# Patient Record
Sex: Female | Born: 1990 | Race: Asian | Hispanic: No | Marital: Married | Smoking: Never smoker
Health system: Southern US, Community
[De-identification: ages and names within clinical notes are randomized; demographics above are authoritative.]

## PROBLEM LIST (undated history)

## (undated) DIAGNOSIS — O0930 Supervision of pregnancy with insufficient antenatal care, unspecified trimester: Secondary | ICD-10-CM

## (undated) DIAGNOSIS — R21 Rash and other nonspecific skin eruption: Secondary | ICD-10-CM

## (undated) HISTORY — DX: Supervision of pregnancy with insufficient antenatal care, unspecified trimester: O09.30

## (undated) HISTORY — DX: Rash and other nonspecific skin eruption: R21

---

## 2011-01-13 ENCOUNTER — Ambulatory Visit
Admission: RE | Admit: 2011-01-13 | Discharge: 2011-01-13 | Disposition: A | Discharge status: Home / Self Care | Payer: No Typology Code available for payment source | Source: Ambulatory Visit | Attending: Infectious Diseases | Admitting: Infectious Diseases

## 2011-01-13 ENCOUNTER — Other Ambulatory Visit: Payer: Self-pay | Admitting: Infectious Diseases

## 2011-01-13 DIAGNOSIS — R9389 Abnormal findings on diagnostic imaging of other specified body structures: Secondary | ICD-10-CM

## 2011-03-11 ENCOUNTER — Other Ambulatory Visit (HOSPITAL_COMMUNITY): Payer: Self-pay | Admitting: Physician Assistant

## 2011-03-11 DIAGNOSIS — O0930 Supervision of pregnancy with insufficient antenatal care, unspecified trimester: Secondary | ICD-10-CM

## 2011-03-11 DIAGNOSIS — Z3689 Encounter for other specified antenatal screening: Secondary | ICD-10-CM

## 2011-03-11 LAB — HIV ANTIBODY (ROUTINE TESTING W REFLEX): HIV: NONREACTIVE

## 2011-03-11 LAB — GC/CHLAMYDIA PROBE AMP, GENITAL
Chlamydia: NEGATIVE
Gonorrhea: NEGATIVE

## 2011-03-11 LAB — ABO/RH

## 2011-03-11 LAB — RPR: RPR: NONREACTIVE

## 2011-03-18 ENCOUNTER — Ambulatory Visit (HOSPITAL_COMMUNITY)
Admission: RE | Admit: 2011-03-18 | Discharge: 2011-03-18 | Disposition: A | Discharge status: Home / Self Care | Payer: Medicaid Other | Source: Ambulatory Visit | Attending: Physician Assistant | Admitting: Physician Assistant

## 2011-03-18 DIAGNOSIS — Z3689 Encounter for other specified antenatal screening: Secondary | ICD-10-CM

## 2011-03-18 DIAGNOSIS — Z1389 Encounter for screening for other disorder: Secondary | ICD-10-CM | POA: Insufficient documentation

## 2011-03-18 DIAGNOSIS — O0930 Supervision of pregnancy with insufficient antenatal care, unspecified trimester: Secondary | ICD-10-CM

## 2011-03-18 DIAGNOSIS — O358XX0 Maternal care for other (suspected) fetal abnormality and damage, not applicable or unspecified: Secondary | ICD-10-CM | POA: Insufficient documentation

## 2011-03-18 DIAGNOSIS — Z363 Encounter for antenatal screening for malformations: Secondary | ICD-10-CM | POA: Insufficient documentation

## 2011-06-06 ENCOUNTER — Other Ambulatory Visit (HOSPITAL_COMMUNITY): Payer: Self-pay | Admitting: Physician Assistant

## 2011-06-06 DIAGNOSIS — R109 Unspecified abdominal pain: Secondary | ICD-10-CM

## 2011-06-09 ENCOUNTER — Ambulatory Visit (HOSPITAL_COMMUNITY)
Admission: RE | Admit: 2011-06-09 | Discharge: 2011-06-09 | Disposition: A | Discharge status: Home / Self Care | Payer: Medicaid Other | Source: Ambulatory Visit | Attending: Physician Assistant | Admitting: Physician Assistant

## 2011-06-09 DIAGNOSIS — R1011 Right upper quadrant pain: Secondary | ICD-10-CM | POA: Insufficient documentation

## 2011-06-09 DIAGNOSIS — R109 Unspecified abdominal pain: Secondary | ICD-10-CM

## 2011-07-18 LAB — STREP B DNA PROBE: GBS: NEGATIVE

## 2011-08-14 ENCOUNTER — Other Ambulatory Visit (HOSPITAL_COMMUNITY): Payer: Self-pay | Admitting: Physician Assistant

## 2011-08-14 DIAGNOSIS — O480 Post-term pregnancy: Secondary | ICD-10-CM

## 2011-08-18 ENCOUNTER — Inpatient Hospital Stay (HOSPITAL_COMMUNITY)
Admission: AD | Admit: 2011-08-18 | Discharge: 2011-08-18 | Disposition: A | Discharge status: Home / Self Care | Payer: Medicaid Other | Source: Ambulatory Visit | Attending: Obstetrics & Gynecology | Admitting: Obstetrics & Gynecology

## 2011-08-18 ENCOUNTER — Encounter (HOSPITAL_COMMUNITY): Payer: Self-pay | Admitting: *Deleted

## 2011-08-18 ENCOUNTER — Ambulatory Visit (HOSPITAL_COMMUNITY)
Admission: RE | Admit: 2011-08-18 | Discharge: 2011-08-18 | Disposition: A | Discharge status: Home / Self Care | Payer: Medicaid Other | Source: Ambulatory Visit | Attending: Physician Assistant | Admitting: Physician Assistant

## 2011-08-18 DIAGNOSIS — O480 Post-term pregnancy: Secondary | ICD-10-CM | POA: Insufficient documentation

## 2011-08-18 DIAGNOSIS — Z3689 Encounter for other specified antenatal screening: Secondary | ICD-10-CM

## 2011-08-18 DIAGNOSIS — O368390 Maternal care for abnormalities of the fetal heart rate or rhythm, unspecified trimester, not applicable or unspecified: Secondary | ICD-10-CM | POA: Insufficient documentation

## 2011-08-18 DIAGNOSIS — Z36 Encounter for antenatal screening of mother: Secondary | ICD-10-CM

## 2011-08-18 NOTE — ED Provider Notes (Signed)
  History     CSN: [id number]  Arrival date and time: 08/18/11 1745   None     Chief Complaint  Patient presents with  . Non-stress Test  Nepali interpreter from [employer] used over phone. HPI 21 yo G1 here at 40weeks 5 days.  States she was sent over from the [facility] because of fetal tachycardia with a HR of 175bpm.  She states she had not had anything to eat or drink this am prior to her appointment.  She has had good fetal movement.  She denies feeling any contractions, denies vaginal bleeding, LOF, or discharge.  She had a BPP this morning.   OB History    Grav Para Term Preterm Abortions TAB SAB Ect Mult Living   1               No past medical history on file.  No past surgical history on file.  Family History  Problem Relation Age of Onset  . Hypertension Mother     History  Substance Use Topics  . Smoking status: Never Smoker   . Smokeless tobacco: Not on file  . Alcohol Use: No    Allergies: No Known Allergies  Prescriptions prior to admission  Medication Sig Dispense Refill  . Prenatal Vit-Fe Fumarate-FA (PRENATAL MULTIVITAMIN) TABS Take 1 tablet by mouth daily.        Review of Systems  Gastrointestinal: Negative for abdominal pain.  Genitourinary: Negative for dysuria, urgency and frequency.   Physical Exam   Blood pressure 124/74, pulse 87, temperature 98.6 F (37 C), temperature source Oral, resp. rate 18.  Physical Exam  Constitutional: She is oriented to person, place, and time. She appears well-developed and well-nourished. No distress.  GI: She exhibits distension (gravid). There is no tenderness.  Neurological: She is alert and oriented to person, place, and time.    MAU Course  Procedures  -Sent from [facility] for concern of fetal tachycardia  NST:  Contractions q10-12 minutes.  Category I tracing with good accels.  No decels noted.   Good variability  BPP 08/18/11: 8/8  Assessment and Plan  1.  Fetal tachycardia  -Not seen  during observation in MAU.  May have had accels to 175 when seen in clinic. NST  reassuring.  BPP 8/8 this morning reassuring.  -f/u [facility]  MATTHEWS,CODY 08/18/2011, 6:52 PM   I have seen and examined this patient in conjunction with Dr Ashley Royalty.  I have taken this history and performed the exam.  I agree with the note as written above and have made corrections as needed.   Candelaria Celeste JEHIEL 08/18/2011 7:32 PM

## 2011-08-18 NOTE — MAU Note (Signed)
Health dept called, fetal tach, ? Baseline.  Pt denies pain or bleeding.

## 2011-08-19 ENCOUNTER — Encounter (HOSPITAL_COMMUNITY): Payer: Self-pay | Admitting: *Deleted

## 2011-08-19 ENCOUNTER — Telehealth (HOSPITAL_COMMUNITY): Payer: Self-pay | Admitting: *Deleted

## 2011-08-19 NOTE — Telephone Encounter (Signed)
Preadmission screen Translator number [phone]

## 2011-08-23 ENCOUNTER — Encounter (HOSPITAL_COMMUNITY): Payer: Self-pay

## 2011-08-23 ENCOUNTER — Inpatient Hospital Stay (HOSPITAL_COMMUNITY)
Admission: RE | Admit: 2011-08-23 | Discharge: 2011-08-28 | DRG: 765 | Disposition: A | Discharge status: Home / Self Care | Payer: Medicaid Other | Source: Ambulatory Visit | Attending: Obstetrics & Gynecology | Admitting: Obstetrics & Gynecology

## 2011-08-23 VITALS — BP 114/82 | HR 90 | Temp 98.0°F | Resp 18 | Ht 64.0 in | Wt 176.0 lb

## 2011-08-23 DIAGNOSIS — O480 Post-term pregnancy: Principal | ICD-10-CM | POA: Diagnosis present

## 2011-08-23 DIAGNOSIS — N719 Inflammatory disease of uterus, unspecified: Secondary | ICD-10-CM | POA: Diagnosis present

## 2011-08-23 DIAGNOSIS — O2390 Unspecified genitourinary tract infection in pregnancy, unspecified trimester: Secondary | ICD-10-CM | POA: Diagnosis present

## 2011-08-23 DIAGNOSIS — Z98891 History of uterine scar from previous surgery: Secondary | ICD-10-CM

## 2011-08-23 LAB — CBC
Hemoglobin: 11.5 g/dL — ABNORMAL LOW (ref 12.0–15.0)
Platelets: 211 10*3/uL (ref 150–400)
RBC: 4.35 MIL/uL (ref 3.87–5.11)

## 2011-08-23 MED ORDER — OXYTOCIN BOLUS FROM INFUSION
500.0000 mL | Freq: Once | INTRAVENOUS | Status: DC
  Filled 2011-08-23: qty 500
  Filled 2011-08-23: qty 1000

## 2011-08-23 MED ORDER — FLEET ENEMA 7-19 GM/118ML RE ENEM: 1.0000 | ENEMA | RECTAL | Status: DC | PRN

## 2011-08-23 MED ORDER — MISOPROSTOL 25 MCG QUARTER TABLET
25.0000 ug | ORAL_TABLET | ORAL | Status: DC | PRN
  Administered 2011-08-23 – 2011-08-24 (×3): 25 ug via VAGINAL
  Filled 2011-08-23 (×3): qty 0.25

## 2011-08-23 MED ORDER — LIDOCAINE HCL (PF) 1 % IJ SOLN: 30.0000 mL | INTRAMUSCULAR | Status: DC | PRN

## 2011-08-23 MED ORDER — ZOLPIDEM TARTRATE 10 MG PO TABS
10.0000 mg | ORAL_TABLET | Freq: Every evening | ORAL | Status: DC | PRN
  Administered 2011-08-24: 10 mg via ORAL
  Filled 2011-08-23: qty 1

## 2011-08-23 MED ORDER — OXYTOCIN 20 UNITS IN LACTATED RINGERS INFUSION - SIMPLE
125.0000 mL/h | Freq: Once | INTRAVENOUS | Status: DC
  Filled 2011-08-23: qty 1000

## 2011-08-23 MED ORDER — ONDANSETRON HCL 4 MG/2ML IJ SOLN: 4.0000 mg | Freq: Four times a day (QID) | INTRAMUSCULAR | Status: DC | PRN

## 2011-08-23 MED ORDER — TERBUTALINE SULFATE 1 MG/ML IJ SOLN: 0.2500 mg | Freq: Once | INTRAMUSCULAR | Status: AC | PRN

## 2011-08-23 MED ORDER — CITRIC ACID-SODIUM CITRATE 334-500 MG/5ML PO SOLN
30.0000 mL | ORAL | Status: DC | PRN
  Administered 2011-08-26: 30 mL via ORAL
  Filled 2011-08-23: qty 15

## 2011-08-23 MED ORDER — LACTATED RINGERS IV SOLN
500.0000 mL | INTRAVENOUS | Status: DC | PRN
  Administered 2011-08-25: 500 mL via INTRAVENOUS

## 2011-08-23 MED ORDER — IBUPROFEN 600 MG PO TABS: 600.0000 mg | ORAL_TABLET | Freq: Four times a day (QID) | ORAL | Status: DC | PRN

## 2011-08-23 MED ORDER — LACTATED RINGERS IV SOLN
INTRAVENOUS | Status: DC
  Administered 2011-08-23 – 2011-08-26 (×10): via INTRAVENOUS

## 2011-08-23 MED ORDER — ACETAMINOPHEN 325 MG PO TABS
650.0000 mg | ORAL_TABLET | ORAL | Status: DC | PRN
  Administered 2011-08-26: 650 mg via ORAL
  Filled 2011-08-23: qty 2

## 2011-08-23 MED ORDER — BUTORPHANOL TARTRATE 2 MG/ML IJ SOLN
1.0000 mg | INTRAMUSCULAR | Status: DC | PRN
  Administered 2011-08-25: 1 mg via INTRAVENOUS
  Filled 2011-08-23: qty 1

## 2011-08-23 MED ORDER — OXYCODONE-ACETAMINOPHEN 5-325 MG PO TABS: 1.0000 | ORAL_TABLET | ORAL | Status: DC | PRN

## 2011-08-23 NOTE — H&P (Signed)
Kelly Rosario is a 21 y.o. female G1 presenting for IOL for post dates pregnancy.  Reports no contractions, vaginal bleeding/discharge, leaking of fluid.  She is feeling baby move well.  Would like to do nexplanon for contraception and plans to breast feed Maternal Medical History:  Reason for admission: Reason for Admission:   nauseaIOL for post dates pregnancy   Fetal activity: Perceived fetal activity is normal.      OB History    Grav Para Term Preterm Abortions TAB SAB Ect Mult Living   1              Past Medical History  Diagnosis Date  . Late prenatal care   . Rash     atarax used   History reviewed. No pertinent past surgical history. Family History: family history includes Hypertension in her mother. Social History:  reports that she has never smoked. She has never used smokeless tobacco. She reports that she does not drink alcohol or use illicit drugs.  Review of Systems  Constitutional: Negative for fever and chills.  Eyes: Negative for blurred vision.  Respiratory: Negative for cough.   Cardiovascular: Negative for chest pain.  Gastrointestinal: Negative for nausea.  Neurological: Negative for headaches.    Dilation: Fingertip Effacement (%): 20 Station: -3 Exam by:: Dr. Jerolyn Center Blood pressure 135/85, pulse 106, height 5\' 4"  (1.626 m), weight 79.833 kg (176 lb). Maternal Exam:  Uterine Assessment: Contraction strength is mild.  Contraction duration is 45 seconds. Contraction frequency is irregular.   Abdomen: Fetal presentation: vertex  Pelvis: adequate for delivery.      Fetal Exam Fetal Monitor Review: Baseline rate: 170.  Variability: moderate (6-25 bpm).   Pattern: accelerations present and no decelerations.    Fetal State Assessment: Category I - tracings are normal.     Physical Exam  Constitutional: She is oriented to person, place, and time. She appears well-nourished. No distress.  HENT:  Mouth/Throat: Oropharynx is clear and moist.    Neck: Neck supple.  Cardiovascular: Normal rate, regular rhythm and normal heart sounds.   Respiratory: Effort normal and breath sounds normal. No respiratory distress.  GI: She exhibits distension (gravid). There is no tenderness.  Musculoskeletal: She exhibits edema (1+).  Neurological: She is alert and oriented to person, place, and time.    Prenatal labs: ABO, Rh: A/Positive/-- (10/23 0000) Antibody: Negative (10/23 0000) Rubella: Immune (10/23 0000) RPR: Nonreactive (10/23 0000)  HBsAg: Negative (10/23 0000)  HIV: Non-reactive (10/23 0000)  GBS: Negative (03/01 0000)   Assessment/Plan: 1. IOL for post-dates of 41w3 days gestation  -Bishops of 1, will start with cytotec  -GBS negative  -Nexplanon for contraception PP  -Plans to breast feed.   Yunique Dearcos 08/23/2011, 8:45 PM

## 2011-08-23 NOTE — Progress Notes (Signed)
Pt signs consent form to let husband translate.

## 2011-08-23 NOTE — H&P (Signed)
Attestation of Attending Supervision of Resident: Evaluation and management procedures were performed by the [facility] Resident under my supervision.  I have seen and examined the patient, reviewed the resident's note and chart, and I agree with management and plan.   Jaynie Collins, M.D. 08/23/2011 9:49 PM

## 2011-08-24 ENCOUNTER — Encounter (HOSPITAL_COMMUNITY): Payer: Self-pay

## 2011-08-24 LAB — PROTEIN / CREATININE RATIO, URINE
Creatinine, Urine: 24.58 mg/dL
Protein Creatinine Ratio: 0.16 — ABNORMAL HIGH (ref 0.00–0.15)

## 2011-08-24 LAB — COMPREHENSIVE METABOLIC PANEL
ALT: 12 U/L (ref 0–35)
AST: 17 U/L (ref 0–37)
Calcium: 9.3 mg/dL (ref 8.4–10.5)
Creatinine, Ser: 0.45 mg/dL — ABNORMAL LOW (ref 0.50–1.10)
GFR calc Af Amer: >90 mL/min (ref >90)
Glucose, Bld: 67 mg/dL — ABNORMAL LOW (ref 70–99)
Sodium: 133 mEq/L — ABNORMAL LOW (ref 135–145)
Total Protein: 5.9 g/dL — ABNORMAL LOW (ref 6.0–8.3)

## 2011-08-24 LAB — RPR: RPR Ser Ql: NONREACTIVE

## 2011-08-24 NOTE — Progress Notes (Signed)
Elasia Inscoe is a 21 y.o. G1P0 at [redacted]w[redacted]d by 19 wk  ultrasound admitted for induction of labor due to Post dates. Due date 08/13/11.  Subjective: Anxious. Slept most of night. Denies pain.   Objective: BP 124/86  Pulse 79  Temp(Src) 97.9 F (36.6 C) (Oral)  Resp 16  Ht 5\' 4"  (1.626 m)  Wt 79.833 kg (176 lb)  BMI 30.21 kg/m2      FHT:  135 baseline, moderate variability, accels present, occ mild variable UC:   irregular, every 6 minutes SVE:   Dilation: Fingertip Effacement (%): 20 Station: -2 Exam by:: Dorissa Stinnette Posterior, soft, ft to closed, cytotec placed in posterior fornix Labs: Lab Results  Component Value Date   WBC 13.1* 08/23/2011   HGB 11.5* 08/23/2011   HCT 35.2* 08/23/2011   MCV 80.9 08/23/2011   PLT 211 08/23/2011    Assessment / Plan: G1 at 41.4 wks BEGA Cx unfavorable  Recheck in 4 hrs  Labor: not in labor Preeclampsia:   Fetal Wellbeing:  Category I Pain Control:  n/a Anticipated MOD:  NSVD  Lisbet Busker 08/24/2011, 9:05 AM

## 2011-08-24 NOTE — Progress Notes (Addendum)
Patient ID: Kelly Rosario, female   DOB: Aug 12, 1990, 21 y.o.   MRN: [id number] Gael Delude is a 21 y.o. G1P0 at [redacted]w[redacted]d admitted forIOL indicated by postdates   Subjective: Comfortable and dozing until about 1 hr ago began feeling contractions. Declines analgesia. No H/A or visual disturbance.  Objective: BP 126/90  Pulse 87  Temp(Src) 97.9 F (36.6 C) (Oral)  Resp 18  Ht 5\' 4"  (1.626 m)  Wt 79.833 kg (176 lb)  BMI 30.21 kg/m2 Range BPs: 101-136/56-97  Fetal Heart FHR: 140 bpm, variability: moderate,  accelerations:  Present,  decelerations:  Absent except occ mild variable when supine  Contractions: q2-3 min x 50-90 sec  SVE:   Dilation: Fingertip Effacement (%): 20 Station: -2 Exam by:: Valma Rotenberg  Cx posterior tight 1/60/-2 Spec: Cx visualized and Foley bulb placed  Assessment / Plan:   Labor: none, cx unripe BP elevations - > check preE labs Fetal Wellbeing: Category 1 Pain Control:  Coping Expected mode of delivery: NSVD  Gissele Narducci 08/24/2011, 3:19 PM

## 2011-08-24 NOTE — Progress Notes (Signed)
Travia Winthrop is a 21 y.o. G1P0 at [redacted]w[redacted]d   Subjective: Comfortable, occasional ctx  Objective: BP 116/71  Pulse 88  Temp(Src) 98.5 F (36.9 C) (Oral)  Resp 18  Ht 5\' 4"  (1.626 m)  Wt 176 lb (79.833 kg)  BMI 30.21 kg/m2      FHT:  FHR: 140 bpm, variability: moderate,  accelerations:  Present,  decelerations:  Absent UC:   irregular, every 8-10 minutes SVE:  Foley bulb in place  Labs: Lab Results  Component Value Date   WBC 13.1* 08/23/2011   HGB 11.5* 08/23/2011   HCT 35.2* 08/23/2011   MCV 80.9 08/23/2011   PLT 211 08/23/2011    Assessment / Plan: Cervical Ripening in progress  Labor: Progressing normally Preeclampsia:  n/a Fetal Wellbeing:  Category I Pain Control:  Labor support without medications I/D:  n/a Anticipated MOD:  NSVD Ambien prn sleep  Georgian Mcclory E. 08/24/2011, 11:43 PM

## 2011-08-24 NOTE — Progress Notes (Signed)
Reene Strandberg is a 21 y.o. G1P0 at [redacted]w[redacted]d admitted for induction of labor due to Post dates.   Subjective: Sleeping, appears comfortable  Objective: BP 109/70  Pulse 94  Temp(Src) 98.7 F (37.1 C) (Oral)  Resp 14  Ht 5\' 4"  (1.626 m)  Wt 79.833 kg (176 lb)  BMI 30.21 kg/m2      FHT:  FHR: 150 bpm, variability: moderate,  accelerations:  Present,  decelerations:  Absent UC:   irregular, every 1-5 minutes SVE:   Dilation: Fingertip Effacement (%): 20 Station: -2 Exam by:: Lilli Few, RN  Labs: Lab Results  Component Value Date   WBC 13.1* 08/23/2011   HGB 11.5* 08/23/2011   HCT 35.2* 08/23/2011   MCV 80.9 08/23/2011   PLT 211 08/23/2011    Assessment / Plan: Induction of labor due to postterm,  2nd cytotec placed by RN  Labor: Progressing normally Fetal Wellbeing:  Category I Pain Control:  Labor support without medications I/D:  n/a Anticipated MOD:  NSVD  Jonessa Triplett 08/24/2011, 1:57 AM

## 2011-08-24 NOTE — Progress Notes (Signed)
Kelly Rosario is a 21 y.o. G1P0 at [redacted]w[redacted]d   Subjective: Feeling some contractions  Objective: BP 134/87  Pulse 81  Temp(Src) 98.3 F (36.8 C) (Oral)  Resp 18  Ht 5\' 4"  (1.626 m)  Wt 176 lb (79.833 kg)  BMI 30.21 kg/m2      FHT:  FHR: 135 bpm, variability: moderate,  accelerations:  Present,  decelerations:  Absent UC:   irregular, every 2-7 minutes SVE:   Dilation: 1 Effacement (%): 60 Station: -2 Exam by:: Poe  Labs: Lab Results  Component Value Date   WBC 13.1* 08/23/2011   HGB 11.5* 08/23/2011   HCT 35.2* 08/23/2011   MCV 80.9 08/23/2011   PLT 211 08/23/2011    Assessment / Plan: IOL in progress  Labor: Foley in place for cervical ripening Preeclampsia:  No s/s Fetal Wellbeing:  Category I Pain Control:  Labor support without medications I/D:  n/a Anticipated MOD:  NSVD  Eusevio Schriver E. 08/24/2011, 8:25 PM

## 2011-08-25 MED ORDER — EPHEDRINE 5 MG/ML INJ: 10.0000 mg | INTRAVENOUS | Status: DC | PRN

## 2011-08-25 MED ORDER — LIDOCAINE HCL (PF) 1 % IJ SOLN
INTRAMUSCULAR | Status: DC | PRN
  Administered 2011-08-25 (×3): 4 mL

## 2011-08-25 MED ORDER — LACTATED RINGERS IV SOLN
500.0000 mL | Freq: Once | INTRAVENOUS | Status: AC
  Administered 2011-08-25: 500 mL via INTRAVENOUS

## 2011-08-25 MED ORDER — TERBUTALINE SULFATE 1 MG/ML IJ SOLN: 0.2500 mg | Freq: Once | INTRAMUSCULAR | Status: AC | PRN

## 2011-08-25 MED ORDER — FENTANYL 2.5 MCG/ML BUPIVACAINE 1/10 % EPIDURAL INFUSION (WH - ANES)
14.0000 mL/h | INTRAMUSCULAR | Status: DC
  Administered 2011-08-25 – 2011-08-26 (×8): 14 mL/h via EPIDURAL
  Filled 2011-08-25 (×9): qty 60

## 2011-08-25 MED ORDER — EPHEDRINE 5 MG/ML INJ
10.0000 mg | INTRAVENOUS | Status: DC | PRN
  Filled 2011-08-25: qty 4

## 2011-08-25 MED ORDER — OXYTOCIN 20 UNITS IN LACTATED RINGERS INFUSION - SIMPLE
1.0000 m[IU]/min | INTRAVENOUS | Status: DC
  Administered 2011-08-25: 2 m[IU]/min via INTRAVENOUS
  Administered 2011-08-26 (×2): 18 m[IU]/min via INTRAVENOUS
  Administered 2011-08-26 (×2): 14 m[IU]/min via INTRAVENOUS
  Administered 2011-08-26: 16 m[IU]/min via INTRAVENOUS

## 2011-08-25 MED ORDER — DIPHENHYDRAMINE HCL 50 MG/ML IJ SOLN: 12.5000 mg | INTRAMUSCULAR | Status: DC | PRN

## 2011-08-25 MED ORDER — PHENYLEPHRINE 40 MCG/ML (10ML) SYRINGE FOR IV PUSH (FOR BLOOD PRESSURE SUPPORT)
80.0000 ug | PREFILLED_SYRINGE | INTRAVENOUS | Status: DC | PRN
  Filled 2011-08-25: qty 5

## 2011-08-25 MED ORDER — PHENYLEPHRINE 40 MCG/ML (10ML) SYRINGE FOR IV PUSH (FOR BLOOD PRESSURE SUPPORT): 80.0000 ug | PREFILLED_SYRINGE | INTRAVENOUS | Status: DC | PRN

## 2011-08-25 NOTE — Progress Notes (Signed)
Davonna Hagadorn is a 21 y.o. G1P0 at [redacted]w[redacted]d   Subjective: Sleeping well  Objective: BP 103/70  Pulse 92  Temp(Src) 98.5 F (36.9 C) (Axillary)  Resp 16  Ht 5\' 4"  (1.626 m)  Wt 176 lb (79.833 kg)  BMI 30.21 kg/m2      FHT:  FHR: 145 bpm, variability: moderate,  accelerations:  Present,  decelerations:  Absent UC:   irregular, every 10 minutes SVE:  Foley bulb removed. CVX: 4/thick/vtx/-3  Labs: Lab Results  Component Value Date   WBC 13.1* 08/23/2011   HGB 11.5* 08/23/2011   HCT 35.2* 08/23/2011   MCV 80.9 08/23/2011   PLT 211 08/23/2011    Assessment / Plan: IOL inprogress  Labor: Will start pitocin Preeclampsia:  n/a Fetal Wellbeing:  Category I Pain Control:  Labor support without medications I/D:  n/a Anticipated MOD:  NSVD  Deandra Goering E. 08/25/2011, 5:44 AM

## 2011-08-25 NOTE — Progress Notes (Signed)
Kelly Rosario is a 21 y.o. G1P0 at [redacted]w[redacted]d  admitted for induction of labor due to Post dates.  Subjective: Becoming more uncomfortable.  Interpreter line used, requesting epidural.   Objective: BP 102/69  Pulse 83  Temp(Src) 98 F (36.7 C) (Oral)  Resp 18  Ht 5\' 4"  (1.626 m)  Wt 79.833 kg (176 lb)  BMI 30.21 kg/m2      FHT:  FHR: 140 bpm, variability: minimal ,  accelerations:  Present,  decelerations:  Absent- Just received stadol UC:   regular, every 1-5 minutes SVE:   Dilation: 5.5 Effacement (%): 60 Station: -2 Exam by:: Lorretta Harp RNC  Labs: Lab Results  Component Value Date   WBC 13.1* 08/23/2011   HGB 11.5* 08/23/2011   HCT 35.2* 08/23/2011   MCV 80.9 08/23/2011   PLT 211 08/23/2011    Assessment / Plan: Induction of labor due to postterm,  progressing well on pitocin  Labor: Progressing normally Preeclampsia:  n/a Fetal Wellbeing:  Category II Pain Control:  Stadol, planning for epidural I/D:  n/a Anticipated MOD:  NSVD  Cesilia Shinn 08/25/2011, 3:13 PM

## 2011-08-25 NOTE — Anesthesia Preprocedure Evaluation (Signed)
Anesthesia Evaluation  Patient identified by MRN, date of birth, ID band Patient awake    Reviewed: Allergy & Precautions, H&P , NPO status , Patient's Chart, lab work & pertinent test results, reviewed documented beta blocker date and time   History of Anesthesia Complications Negative for: history of anesthetic complications  Airway Mallampati: IV TM Distance: >3 FB Neck ROM: full    Dental  (+) Teeth Intact   Pulmonary neg pulmonary ROS,  breath sounds clear to auscultation        Cardiovascular negative cardio ROS  Rhythm:regular Rate:Normal     Neuro/Psych negative neurological ROS  negative psych ROS   GI/Hepatic negative GI ROS, Neg liver ROS,   Endo/Other  negative endocrine ROS  Renal/GU negative Renal ROS     Musculoskeletal   Abdominal   Peds  Hematology negative hematology ROS (+)   Anesthesia Other Findings   Reproductive/Obstetrics (+) Pregnancy                           Anesthesia Physical Anesthesia Plan  ASA: II  Anesthesia Plan: Epidural   Post-op Pain Management:    Induction:   Airway Management Planned:   Additional Equipment:   Intra-op Plan:   Post-operative Plan:   Informed Consent: I have reviewed the patients History and Physical, chart, labs and discussed the procedure including the risks, benefits and alternatives for the proposed anesthesia with the patient or authorized representative who has indicated his/her understanding and acceptance.     Plan Discussed with:   Anesthesia Plan Comments:         Anesthesia Quick Evaluation  

## 2011-08-25 NOTE — Progress Notes (Signed)
   Subjective: Pt comfortable after epidural  Objective: BP 121/76  Pulse 93  Temp(Src) 97.8 F (36.6 C) (Oral)  Resp 20  Ht 5\' 4"  (1.626 m)  Wt 79.833 kg (176 lb)  BMI 30.21 kg/m2  SpO2 96%      FHT:  FHR: 120's bpm, variability: moderate,  accelerations:  Present,  decelerations:  Present variables; late decel x 2 with return to baseline. UC:   irregular, every 5-6 minutes; MVU 120-160 SVE:   Dilation: 5 Effacement (%): 60 Station: -2;-1 Exam by:: Lorretta Harp RNC  Labs: Lab Results  Component Value Date   WBC 13.1* 08/23/2011   HGB 11.5* 08/23/2011   HCT 35.2* 08/23/2011   MCV 80.9 08/23/2011   PLT 211 08/23/2011    Assessment / Plan: Induction of Labor, Post-term Inadequate Contraction Pattern  Labor: Inadequate Contractions Preeclampsia:  n/a Fetal Wellbeing:  Category II Pain Control:  Epidural I/D:  n/a Anticipated MOD:  NSVD  [facility] 08/25/2011, 6:03 PM

## 2011-08-25 NOTE — Anesthesia Procedure Notes (Addendum)
Epidural Patient location during procedure: OB Start time: 08/25/2011 3:41 PM Reason for block: procedure for pain  Staffing Anesthesiologist: Khriz Liddy Performed by: anesthesiologist   Preanesthetic Checklist Completed: patient identified, site marked, surgical consent, pre-op evaluation, timeout performed, IV checked, risks and benefits discussed and monitors and equipment checked  Epidural Patient position: sitting Prep: site prepped and draped and DuraPrep Patient monitoring: continuous pulse ox and blood pressure Approach: midline Injection technique: LOR air  Needle:  Needle type: Tuohy  Needle gauge: 17 G Needle length: 9 cm Needle insertion depth: 4 cm Catheter type: closed end flexible Catheter size: 19 Gauge Catheter at skin depth: 9 cm Test dose: negative  Assessment Events: blood not aspirated, injection not painful, no injection resistance, negative IV test and no paresthesia  Additional Notes Discussed risk of headache, infection, bleeding, nerve injury and failed or incomplete block.  Patient voices understanding and wishes to proceed.  Explained with assistance of [place] line interpreter throughout procedure.  Jasmine December, MD

## 2011-08-25 NOTE — Progress Notes (Addendum)
Kelly Rosario is a 21 y.o. G1P0 at [redacted]w[redacted]d  admitted for induction of labor due to Post dates.  Subjective: Called by RN because patient with tachysystole, late decels and poor variability on FHT.   Patient denies feeling any pressure, pain  Objective: BP 128/76  Pulse 80  Temp(Src) 98.5 F (36.9 C) (Oral)  Resp 20  Ht 5\' 4"  (1.626 m)  Wt 79.833 kg (176 lb)  BMI 30.21 kg/m2      FHT:  FHR: 140 bpm, variability: minimal ,  accelerations:  Present,  decelerations:  Present lates Position changed, fluid bolus given, pitocin dose reduced by 1/2, and started on O2.  FHT improved with better variability and no further decels x10 minutes and contractions slowed.   UC:   regular, every 1-2 minutes, pitocin decreased now every 2-5 min SVE:   Dilation: 4.5 Effacement (%): 50 Station: -3 Exam by:: Dr. Vernard Gambles exam due to patient unable to relax, difficult to feel if membranes intact although Mod. Meconium on glove and bed pad, patient states she started leaking fluid around 5pm yesterday after going to the bathroom.  Recheck by Sid Falcon, CNM confirms ROM, FSE and IUPC placed.   Nepali Nurse, learning disability through [employer] used to explain procedures.   Labs: Lab Results  Component Value Date   WBC 13.1* 08/23/2011   HGB 11.5* 08/23/2011   HCT 35.2* 08/23/2011   MCV 80.9 08/23/2011   PLT 211 08/23/2011    Assessment / Plan: IOL for post dates, FHT improved with decrease in pitocin, O2, fluid bolus and positional change.    Labor: Slowly titrate pitocin back up, appears to have SROM yesterday around 5pm Preeclampsia:  n/a Fetal Wellbeing:  Category II Pain Control:  Labor support without medications I/D:  n/a Anticipated MOD:  NSVD   Continue to monitor Kelly Rosario 08/25/2011, 11:01 AM

## 2011-08-25 NOTE — Progress Notes (Signed)
Jennalee Wrenn is a 21 y.o. G1P0 at [redacted]w[redacted]d admitted for induction of labor due to Post dates. Subjective: Comfortable, no complaints  Objective: BP 120/93  Pulse 103  Temp(Src) 98.5 F (36.9 C) (Oral)  Resp 20  Ht 5\' 4"  (1.626 m)  Wt 79.833 kg (176 lb)  BMI 30.21 kg/m2      FHT:  FHR: 140 bpm, variability: moderate,  accelerations:  Present,  decelerations:  Absent UC:   regular, every 1-3 minutes SVE:   Dilation: 4.5 Effacement (%): 50 Station: -3 Exam by:: Dr. Ashley Royalty  Labs: Lab Results  Component Value Date   WBC 13.1* 08/23/2011   HGB 11.5* 08/23/2011   HCT 35.2* 08/23/2011   MCV 80.9 08/23/2011   PLT 211 08/23/2011    Assessment / Plan: Induction of labor due to postterm,  progressing slowly on pitocin  Labor: Progressing on Pitocin, will continue to increase then AROM Preeclampsia:  N/a Fetal Wellbeing:  Category I Pain Control:  Labor support without medications I/D:  n/a Anticipated MOD:  NSVD  Madaleine Simmon 08/25/2011, 9:29 AM

## 2011-08-25 NOTE — Progress Notes (Signed)
Dr. Margot Ables aware of inadequate contraction pattern, uncertainty of IUPC function and dose of pitocin.  Waiting on attending provider to discuss plan of care and may turn off pitocin.

## 2011-08-25 NOTE — Progress Notes (Signed)
Kelly Rosario is a 21 y.o. G1P0 at [redacted]w[redacted]d   Subjective: Doing well. No urge to push. No abdominal pain  Objective: BP 129/80  Pulse 104  Temp(Src) 98.8 F (37.1 C) (Oral)  Resp 20  Ht 5\' 4"  (1.626 m)  Wt 79.833 kg (176 lb)  BMI 30.21 kg/m2  SpO2 96% I/O last 3 completed shifts: In: -  Out: 850 [Urine:850]    FHT:  FHR: 130-140s bpm, variability: moderate,  accelerations:  Abscent,  decelerations:  Absent UC:   regular, every 2 minutes SVE:   Dilation: 6 Effacement (%): 90 Station: -1 Exam by:: Cammy Copa, RN  Labs: Lab Results  Component Value Date   WBC 13.1* 08/23/2011   HGB 11.5* 08/23/2011   HCT 35.2* 08/23/2011   MCV 80.9 08/23/2011   PLT 211 08/23/2011    Assessment / Plan: Augmentation of labor, progressing well. Fetal scalp monitor no longer working but unable to remove. IUCP in place.  Labor: Will allow pt to rest from pit. Likely restart in 4 hrs Fetal Wellbeing:  Category I Pain Control:  Epidural I/D:  No signs of corioamnionitis Anticipated MOD:  NSVD  Kelly Rosario 08/25/2011, 8:43 PM

## 2011-08-26 ENCOUNTER — Inpatient Hospital Stay (HOSPITAL_COMMUNITY): Payer: Medicaid Other | Admitting: Anesthesiology

## 2011-08-26 ENCOUNTER — Encounter (HOSPITAL_COMMUNITY): Payer: Self-pay

## 2011-08-26 ENCOUNTER — Encounter (HOSPITAL_COMMUNITY): Payer: Self-pay | Admitting: Anesthesiology

## 2011-08-26 ENCOUNTER — Encounter (HOSPITAL_COMMUNITY)
Admission: RE | Disposition: A | Discharge status: Home / Self Care | Payer: Self-pay | Source: Ambulatory Visit | Attending: Obstetrics & Gynecology

## 2011-08-26 DIAGNOSIS — O480 Post-term pregnancy: Secondary | ICD-10-CM

## 2011-08-26 DIAGNOSIS — N719 Inflammatory disease of uterus, unspecified: Secondary | ICD-10-CM

## 2011-08-26 DIAGNOSIS — O2390 Unspecified genitourinary tract infection in pregnancy, unspecified trimester: Secondary | ICD-10-CM

## 2011-08-26 LAB — CBC
HCT: 30.7 % — ABNORMAL LOW (ref 36.0–46.0)
Hemoglobin: 10.2 g/dL — ABNORMAL LOW (ref 12.0–15.0)
MCH: 26.5 pg (ref 26.0–34.0)
MCH: 26.7 pg (ref 26.0–34.0)
MCHC: 33.2 g/dL (ref 30.0–36.0)
MCV: 80.6 fL (ref 78.0–100.0)
MCV: 80.4 fL (ref 78.0–100.0)
Platelets: 151 10*3/uL (ref 150–400)
Platelets: 158 10*3/uL (ref 150–400)
RBC: 4.07 MIL/uL (ref 3.87–5.11)
RBC: 3.82 MIL/uL — ABNORMAL LOW (ref 3.87–5.11)
RDW: 14.5 % (ref 11.5–15.5)
RDW: 14.5 % (ref 11.5–15.5)
WBC: 19.6 10*3/uL — ABNORMAL HIGH (ref 4.0–10.5)
WBC: 20.5 10*3/uL — ABNORMAL HIGH (ref 4.0–10.5)

## 2011-08-26 LAB — COMPREHENSIVE METABOLIC PANEL
ALT: 10 U/L (ref 0–35)
AST: 18 U/L (ref 0–37)
Albumin: 2.6 g/dL — ABNORMAL LOW (ref 3.5–5.2)
CO2: 20 mEq/L (ref 19–32)
Calcium: 9 mg/dL (ref 8.4–10.5)
Creatinine, Ser: 0.52 mg/dL (ref 0.50–1.10)
GFR calc non Af Amer: >90 mL/min (ref >90)
Sodium: 133 mEq/L — ABNORMAL LOW (ref 135–145)

## 2011-08-26 SURGERY — Surgical Case
Anesthesia: Regional | Site: Abdomen | Wound class: Clean Contaminated

## 2011-08-26 MED ORDER — LACTATED RINGERS IV SOLN
INTRAVENOUS | Status: DC | PRN
  Administered 2011-08-26 (×3): via INTRAVENOUS

## 2011-08-26 MED ORDER — MEPERIDINE HCL 25 MG/ML IJ SOLN: 6.2500 mg | INTRAMUSCULAR | Status: DC | PRN

## 2011-08-26 MED ORDER — PRENATAL MULTIVITAMIN CH
1.0000 | ORAL_TABLET | Freq: Every day | ORAL | Status: DC
  Administered 2011-08-28: 1 via ORAL
  Filled 2011-08-26: qty 1

## 2011-08-26 MED ORDER — NALOXONE HCL 0.4 MG/ML IJ SOLN
1.0000 ug/kg/h | INTRAMUSCULAR | Status: DC | PRN
  Filled 2011-08-26: qty 2.5

## 2011-08-26 MED ORDER — DIBUCAINE 1 % RE OINT: 1.0000 "application " | TOPICAL_OINTMENT | RECTAL | Status: DC | PRN

## 2011-08-26 MED ORDER — SENNOSIDES-DOCUSATE SODIUM 8.6-50 MG PO TABS
2.0000 | ORAL_TABLET | Freq: Every day | ORAL | Status: DC
  Administered 2011-08-26 – 2011-08-27 (×2): 2 via ORAL

## 2011-08-26 MED ORDER — TETANUS-DIPHTH-ACELL PERTUSSIS 5-2.5-18.5 LF-MCG/0.5 IM SUSP
0.5000 mL | Freq: Once | INTRAMUSCULAR | Status: AC
  Administered 2011-08-28: 0.5 mL via INTRAMUSCULAR
  Filled 2011-08-26: qty 0.5

## 2011-08-26 MED ORDER — LIDOCAINE-EPINEPHRINE (PF) 2 %-1:200000 IJ SOLN
INTRAMUSCULAR | Status: AC
  Filled 2011-08-26: qty 20

## 2011-08-26 MED ORDER — FENTANYL CITRATE 0.05 MG/ML IJ SOLN
INTRAMUSCULAR | Status: DC | PRN
  Administered 2011-08-26 (×2): 50 ug via INTRAVENOUS

## 2011-08-26 MED ORDER — CHLOROPROCAINE HCL 3 % IJ SOLN
INTRAMUSCULAR | Status: AC
  Filled 2011-08-26: qty 20

## 2011-08-26 MED ORDER — NALBUPHINE SYRINGE 5 MG/0.5 ML
5.0000 mg | INJECTION | INTRAMUSCULAR | Status: DC | PRN
  Filled 2011-08-26: qty 1

## 2011-08-26 MED ORDER — CLINDAMYCIN PHOSPHATE 900 MG/50ML IV SOLN
900.0000 mg | Freq: Three times a day (TID) | INTRAVENOUS | Status: AC
  Administered 2011-08-26 – 2011-08-27 (×3): 900 mg via INTRAVENOUS
  Filled 2011-08-26 (×3): qty 50

## 2011-08-26 MED ORDER — FENTANYL CITRATE 0.05 MG/ML IJ SOLN
INTRAMUSCULAR | Status: AC
  Administered 2011-08-26: 50 ug via INTRAVENOUS
  Filled 2011-08-26: qty 2

## 2011-08-26 MED ORDER — DIPHENHYDRAMINE HCL 25 MG PO CAPS: 25.0000 mg | ORAL_CAPSULE | Freq: Four times a day (QID) | ORAL | Status: DC | PRN

## 2011-08-26 MED ORDER — DIPHENHYDRAMINE HCL 25 MG PO CAPS: 25.0000 mg | ORAL_CAPSULE | ORAL | Status: DC | PRN

## 2011-08-26 MED ORDER — ONDANSETRON HCL 4 MG/2ML IJ SOLN: 4.0000 mg | Freq: Three times a day (TID) | INTRAMUSCULAR | Status: DC | PRN

## 2011-08-26 MED ORDER — BUPIVACAINE HCL (PF) 0.25 % IJ SOLN
INTRAMUSCULAR | Status: DC | PRN
  Administered 2011-08-26: 5 mL via EPIDURAL

## 2011-08-26 MED ORDER — IBUPROFEN 600 MG PO TABS
600.0000 mg | ORAL_TABLET | Freq: Four times a day (QID) | ORAL | Status: DC
  Administered 2011-08-27 – 2011-08-28 (×7): 600 mg via ORAL
  Filled 2011-08-26 (×2): qty 1

## 2011-08-26 MED ORDER — GENTAMICIN SULFATE 40 MG/ML IJ SOLN
170.0000 mg | Freq: Three times a day (TID) | INTRAVENOUS | Status: AC
  Administered 2011-08-26 – 2011-08-27 (×4): 170 mg via INTRAVENOUS
  Filled 2011-08-26 (×4): qty 4.25

## 2011-08-26 MED ORDER — SIMETHICONE 80 MG PO CHEW: 80.0000 mg | CHEWABLE_TABLET | ORAL | Status: DC | PRN

## 2011-08-26 MED ORDER — ONDANSETRON HCL 4 MG PO TABS: 4.0000 mg | ORAL_TABLET | ORAL | Status: DC | PRN

## 2011-08-26 MED ORDER — ONDANSETRON HCL 4 MG/2ML IJ SOLN
INTRAMUSCULAR | Status: AC
  Filled 2011-08-26: qty 2

## 2011-08-26 MED ORDER — SODIUM CHLORIDE 0.9 % IV SOLN
2.0000 g | Freq: Four times a day (QID) | INTRAVENOUS | Status: DC
  Administered 2011-08-26: 2 g via INTRAVENOUS
  Filled 2011-08-26 (×3): qty 2000

## 2011-08-26 MED ORDER — FENTANYL CITRATE 0.05 MG/ML IJ SOLN
100.0000 ug | Freq: Once | INTRAMUSCULAR | Status: AC
  Administered 2011-08-26: 100 ug via EPIDURAL

## 2011-08-26 MED ORDER — SCOPOLAMINE 1 MG/3DAYS TD PT72
1.0000 | MEDICATED_PATCH | Freq: Once | TRANSDERMAL | Status: DC
Start: 2011-08-26 — End: 2011-08-28
  Filled 2011-08-26: qty 1

## 2011-08-26 MED ORDER — CHLOROPROCAINE HCL 3 % IJ SOLN
INTRAMUSCULAR | Status: DC | PRN
  Administered 2011-08-26: 10 mL via EPIDURAL

## 2011-08-26 MED ORDER — MORPHINE SULFATE (PF) 0.5 MG/ML IJ SOLN
INTRAMUSCULAR | Status: DC | PRN
  Administered 2011-08-26: 1 mg via INTRAVENOUS

## 2011-08-26 MED ORDER — KETOROLAC TROMETHAMINE 30 MG/ML IJ SOLN
30.0000 mg | Freq: Four times a day (QID) | INTRAMUSCULAR | Status: DC | PRN
  Administered 2011-08-26: 30 mg via INTRAVENOUS
  Filled 2011-08-26: qty 1

## 2011-08-26 MED ORDER — WITCH HAZEL-GLYCERIN EX PADS: 1.0000 "application " | MEDICATED_PAD | CUTANEOUS | Status: DC | PRN

## 2011-08-26 MED ORDER — SODIUM CHLORIDE 0.9 % IJ SOLN: 3.0000 mL | INTRAMUSCULAR | Status: DC | PRN

## 2011-08-26 MED ORDER — DIPHENHYDRAMINE HCL 50 MG/ML IJ SOLN
12.5000 mg | INTRAMUSCULAR | Status: DC | PRN
  Administered 2011-08-27 (×2): 12.5 mg via INTRAVENOUS
  Filled 2011-08-26 (×2): qty 1

## 2011-08-26 MED ORDER — OXYCODONE-ACETAMINOPHEN 5-325 MG PO TABS
1.0000 | ORAL_TABLET | ORAL | Status: DC | PRN
  Administered 2011-08-27 (×2): 2 via ORAL
  Administered 2011-08-27: 1 via ORAL
  Administered 2011-08-28 (×2): 2 via ORAL
  Filled 2011-08-26: qty 1
  Filled 2011-08-26 (×3): qty 2

## 2011-08-26 MED ORDER — LANOLIN HYDROUS EX OINT: 1.0000 "application " | TOPICAL_OINTMENT | CUTANEOUS | Status: DC | PRN

## 2011-08-26 MED ORDER — FENTANYL CITRATE 0.05 MG/ML IJ SOLN
25.0000 ug | INTRAMUSCULAR | Status: DC | PRN
  Administered 2011-08-26 (×2): 50 ug via INTRAVENOUS

## 2011-08-26 MED ORDER — ONDANSETRON HCL 4 MG/2ML IJ SOLN
INTRAMUSCULAR | Status: DC | PRN
  Administered 2011-08-26: 4 mg via INTRAVENOUS

## 2011-08-26 MED ORDER — KETOROLAC TROMETHAMINE 60 MG/2ML IM SOLN
60.0000 mg | Freq: Once | INTRAMUSCULAR | Status: AC | PRN
  Filled 2011-08-26: qty 2

## 2011-08-26 MED ORDER — OXYTOCIN 20 UNITS IN LACTATED RINGERS INFUSION - SIMPLE: 125.0000 mL/h | INTRAVENOUS | Status: DC

## 2011-08-26 MED ORDER — ONDANSETRON HCL 4 MG/2ML IJ SOLN
4.0000 mg | INTRAMUSCULAR | Status: DC | PRN
  Administered 2011-08-27: 4 mg via INTRAVENOUS
  Filled 2011-08-26: qty 2

## 2011-08-26 MED ORDER — SODIUM BICARBONATE 8.4 % IV SOLN
INTRAVENOUS | Status: DC | PRN
  Administered 2011-08-26 (×2): 5 mL via EPIDURAL

## 2011-08-26 MED ORDER — KETOROLAC TROMETHAMINE 30 MG/ML IJ SOLN: 30.0000 mg | Freq: Four times a day (QID) | INTRAMUSCULAR | Status: DC | PRN

## 2011-08-26 MED ORDER — SIMETHICONE 80 MG PO CHEW
80.0000 mg | CHEWABLE_TABLET | Freq: Three times a day (TID) | ORAL | Status: DC
  Administered 2011-08-26 – 2011-08-28 (×7): 80 mg via ORAL

## 2011-08-26 MED ORDER — ZOLPIDEM TARTRATE 5 MG PO TABS: 5.0000 mg | ORAL_TABLET | Freq: Every evening | ORAL | Status: DC | PRN

## 2011-08-26 MED ORDER — DIPHENHYDRAMINE HCL 50 MG/ML IJ SOLN: 25.0000 mg | INTRAMUSCULAR | Status: DC | PRN

## 2011-08-26 MED ORDER — OXYTOCIN 20 UNITS IN LACTATED RINGERS INFUSION - SIMPLE
INTRAVENOUS | Status: DC | PRN
  Administered 2011-08-26 (×3): 20 [IU] via INTRAVENOUS

## 2011-08-26 MED ORDER — MENTHOL 3 MG MT LOZG: 1.0000 | LOZENGE | OROMUCOSAL | Status: DC | PRN

## 2011-08-26 MED ORDER — MORPHINE SULFATE (PF) 0.5 MG/ML IJ SOLN
INTRAMUSCULAR | Status: DC | PRN
  Administered 2011-08-26: 4 mg via EPIDURAL

## 2011-08-26 MED ORDER — MORPHINE SULFATE 0.5 MG/ML IJ SOLN
INTRAMUSCULAR | Status: AC
  Filled 2011-08-26: qty 10

## 2011-08-26 MED ORDER — METOCLOPRAMIDE HCL 5 MG/ML IJ SOLN: 10.0000 mg | Freq: Three times a day (TID) | INTRAMUSCULAR | Status: DC | PRN

## 2011-08-26 MED ORDER — OXYTOCIN 10 UNIT/ML IJ SOLN
INTRAMUSCULAR | Status: AC
  Filled 2011-08-26: qty 6

## 2011-08-26 MED ORDER — SODIUM BICARBONATE 8.4 % IV SOLN
INTRAVENOUS | Status: AC
  Filled 2011-08-26: qty 50

## 2011-08-26 MED ORDER — IBUPROFEN 600 MG PO TABS
600.0000 mg | ORAL_TABLET | Freq: Four times a day (QID) | ORAL | Status: DC | PRN
  Filled 2011-08-26 (×4): qty 1

## 2011-08-26 MED ORDER — NALOXONE HCL 0.4 MG/ML IJ SOLN: 0.4000 mg | INTRAMUSCULAR | Status: DC | PRN

## 2011-08-26 MED ORDER — FENTANYL CITRATE 0.05 MG/ML IJ SOLN
INTRAMUSCULAR | Status: AC
  Filled 2011-08-26: qty 2

## 2011-08-26 MED ORDER — LACTATED RINGERS IV SOLN
INTRAVENOUS | Status: DC
  Administered 2011-08-26 – 2011-08-27 (×2): via INTRAVENOUS

## 2011-08-26 SURGICAL SUPPLY — 26 items
BARRIER ADHS 3X4 INTERCEED (GAUZE/BANDAGES/DRESSINGS) IMPLANT
CHLORAPREP W/TINT 26ML (MISCELLANEOUS) ×2 IMPLANT
CLOTH BEACON ORANGE TIMEOUT ST (SAFETY) ×2 IMPLANT
DRSG COVADERM 4X10 (GAUZE/BANDAGES/DRESSINGS) ×2 IMPLANT
ELECT REM PT RETURN 9FT ADLT (ELECTROSURGICAL) ×2
ELECTRODE REM PT RTRN 9FT ADLT (ELECTROSURGICAL) ×1 IMPLANT
EXTRACTOR VACUUM KIWI (MISCELLANEOUS) IMPLANT
GLOVE BIO SURGEON STRL SZ 6.5 (GLOVE) ×2 IMPLANT
GLOVE BIOGEL PI IND STRL 7.0 (GLOVE) ×2 IMPLANT
GLOVE BIOGEL PI INDICATOR 7.0 (GLOVE) ×2
GOWN PREVENTION PLUS LG XLONG (DISPOSABLE) ×6 IMPLANT
KIT ABG SYR 3ML LUER SLIP (SYRINGE) IMPLANT
NEEDLE HYPO 25X5/8 SAFETYGLIDE (NEEDLE) IMPLANT
NS IRRIG 1000ML POUR BTL (IV SOLUTION) ×2 IMPLANT
PACK C SECTION WH (CUSTOM PROCEDURE TRAY) ×2 IMPLANT
RETRACTOR WND ALEXIS 25 LRG (MISCELLANEOUS) ×1 IMPLANT
RTRCTR WOUND ALEXIS 25CM LRG (MISCELLANEOUS) ×2
SLEEVE SCD COMPRESS KNEE LRG (MISCELLANEOUS) IMPLANT
SLEEVE SCD COMPRESS KNEE MED (MISCELLANEOUS) ×2 IMPLANT
SUT VIC AB 0 CT1 36 (SUTURE) ×12 IMPLANT
SUT VIC AB 2-0 CT1 27 (SUTURE) ×1
SUT VIC AB 2-0 CT1 TAPERPNT 27 (SUTURE) ×1 IMPLANT
SUT VIC AB 4-0 PS2 27 (SUTURE) ×2 IMPLANT
TOWEL OR 17X24 6PK STRL BLUE (TOWEL DISPOSABLE) ×4 IMPLANT
TRAY FOLEY CATH 14FR (SET/KITS/TRAYS/PACK) IMPLANT
WATER STERILE IRR 1000ML POUR (IV SOLUTION) IMPLANT

## 2011-08-26 NOTE — Progress Notes (Signed)
Kelly Rosario is a 21 y.o. G1P0 at [redacted]w[redacted]d   Subjective: Pushing x 1 hour now  Objective: BP 146/93  Pulse 112  Temp(Src) 100 F (37.8 C) (Oral)  Resp 18  Ht 5\' 4"  (1.626 m)  Wt 79.833 kg (176 lb)  BMI 30.21 kg/m2  SpO2 96% I/O last 3 completed shifts: In: -  Out: 3400 [Urine:3400] Total I/O In: 95 [I.V.:95] Out: -   FHT:  FHR: 125 bpm, variability: moderate,  accelerations:  Present,  decelerations:  Absent UC:   regular, every 2-4 minutes with 44mu/min Pitocin SVE:   Dilation: 10 Effacement (%): 100 Station: +2 Exam by:: Philipp Deputy, CNM- vtx visible with 'cheating', but mainly caput present; appears to be slowly bringing head down  Labs: Lab Results  Component Value Date   WBC 19.6* 08/26/2011   HGB 10.8* 08/26/2011   HCT 32.8* 08/26/2011   MCV 80.6 08/26/2011   PLT 151 08/26/2011    Assessment / Plan: Continue pushing  Kelly Rosario 08/26/2011, 1:00 PM

## 2011-08-26 NOTE — Progress Notes (Signed)
ANTIBIOTIC CONSULT NOTE - INITIAL  Pharmacy Consult for gentamicin Indication: maternal temp  No Known Allergies  Patient Measurements: Height: 5\' 4"  (162.6 cm) Weight: 176 lb (79.833 kg) IBW/kg (Calculated) : 54.7  Adjusted Body Weight: 62.3 kg  Vital Signs: Temp: 100.4 F (38 C) (04/09 1301) Temp src: Axillary (04/09 1301) BP: 140/99 mmHg (04/09 1401) Pulse Rate: 121  (04/09 1401) Intake/Output from previous day: 04/08 0701 - 04/09 0700 In: -  Out: 3400 [Urine:3400] Intake/Output from this shift: Total I/O In: 311.6 [P.O.:120; I.V.:191.6] Out: 725 [Urine:725]  Labs:  [facility] 08/26/11 0828 08/24/11 2150 08/24/11 1641 08/23/11 1930  WBC 19.6* -- -- 13.1*  HGB 10.8* -- -- 11.5*  PLT 151 -- -- 211  LABCREA -- 24.58 -- --  CREATININE 0.52 -- 0.45* --   Estimated Creatinine Clearance: 112.7 ml/min (by C-G formula based on Cr of 0.52). No results found for this basename: VANCOTROUGH:2,VANCOPEAK:2,VANCORANDOM:2,GENTTROUGH:2,GENTPEAK:2,GENTRANDOM:2,TOBRATROUGH:2,TOBRAPEAK:2,TOBRARND:2,AMIKACINPEAK:2,AMIKACINTROU:2,AMIKACIN:2, in the last 72 hours   Microbiology: No results found for this or any previous visit (from the past 720 hour(s)).  Medical History: Past Medical History  Diagnosis Date  . Late prenatal care   . Rash     atarax used    Medications:  Scheduled:    . ampicillin (OMNIPEN) IV  2 g Intravenous Q6H  . fentaNYL      . fentaNYL  100 mcg Epidural Once  . gentamicin  170 mg Intravenous Q8H  . lactated ringers  500 mL Intravenous Once  . oxytocin 20 units in LR 1000 mL  500 mL Intravenous Once  . oxytocin 20 units in LR 1000 mL  125 mL/hr Intravenous Once   Assessment: 21yo G1P0 at [redacted]w[redacted]d.  Ampicillin and gentamicin started for maternal temp.  Tmax=100.4 F.  WBC 19.6.    Goal of Therapy:  Gentamicin peak 6-8, trough <1  Plan:  Gentamicin 170mg  IV Q8 hours to start now.  Will follow patient clinically and check levels if gentamicin continued  postpartum.    Isaias Sakai Scarlett 08/26/2011,2:30 PM

## 2011-08-26 NOTE — Progress Notes (Signed)
IFSE removed.

## 2011-08-26 NOTE — Progress Notes (Signed)
Ikram Gutterman is a 21 y.o. G1P0 at [redacted]w[redacted]d   Subjective: Still with some lower abd pain; ? With contractions or not  Objective: BP 133/88  Pulse 113  Temp(Src) 98.9 F (37.2 C) (Oral)  Resp 18  Ht 5\' 4"  (1.626 m)  Wt 79.833 kg (176 lb)  BMI 30.21 kg/m2  SpO2 96% I/O last 3 completed shifts: In: -  Out: 3400 [Urine:3400]    FHT:  FHR: 120 bpm, variability: moderate,  accelerations:  Present,  decelerations:  Absent UC:   regular, every 2-4 minutes with MVUs 100 at most with Pit at 88mu/min SVE:  vtx now at +2  Labs: Lab Results  Component Value Date   WBC 19.6* 08/26/2011   HGB 10.8* 08/26/2011   HCT 32.8* 08/26/2011   MCV 80.6 08/26/2011   PLT 151 08/26/2011    Assessment / Plan: End 1st stage ROM >24 hours with no s/s infection Plts lower than previously  Will begin to push with contractions and eval progress    Matilyn Fehrman 08/26/2011, 11:35 AM

## 2011-08-26 NOTE — Progress Notes (Addendum)
Kelly Rosario is a 21 y.o. G1P0 at [redacted]w[redacted]d   Subjective: Having some low abd discomfort  Objective: BP 138/89  Pulse 107  Temp(Src) 99 F (37.2 C) (Axillary)  Resp 20  Ht 5\' 4"  (1.626 m)  Wt 79.833 kg (176 lb)  BMI 30.21 kg/m2  SpO2 96% I/O last 3 completed shifts: In: -  Out: 3400 [Urine:3400]    FHT:  FHR: 120 bpm, variability: moderate,  accelerations:  Present,  decelerations:  Absent occ mi variables UC:   regular, every 2-3 minutes; MVUs inadequate (40-100) SVE:   Dilation: 10 Effacement (%): 100 Station: +1 Exam by:: Redding Cloe,RN  Labs: Lab Results  Component Value Date   WBC 19.6* 08/26/2011   HGB 10.8* 08/26/2011   HCT 32.8* 08/26/2011   MCV 80.6 08/26/2011   PLT 151 08/26/2011    Assessment / Plan: Will allow to labor down since no urge to push quite yet PCA epidural for comfort   [name] 08/26/2011, 9:46 AM  Recent PIH labs just rev'd.  Plts down to 151 from 211- LFTs unchanged. Will monitor.  CBC    Component Value Date/Time   WBC 19.6* 08/26/2011 0828   RBC 4.07 08/26/2011 0828   HGB 10.8* 08/26/2011 0828   HCT 32.8* 08/26/2011 0828   PLT 151 08/26/2011 0828   MCV 80.6 08/26/2011 0828   MCH 26.5 08/26/2011 0828   MCHC 32.9 08/26/2011 0828   RDW 14.5 08/26/2011 0828    CMP     Component Value Date/Time   NA 133* 08/26/2011 0828   K 3.7 08/26/2011 0828   CL 99 08/26/2011 0828   CO2 20 08/26/2011 0828   GLUCOSE 69* 08/26/2011 0828   BUN 4* 08/26/2011 0828   CREATININE 0.52 08/26/2011 0828   CALCIUM 9.0 08/26/2011 0828   PROT 5.9* 08/26/2011 0828   ALBUMIN 2.6* 08/26/2011 0828   AST 18 08/26/2011 0828   ALT 10 08/26/2011 0828   ALKPHOS 126* 08/26/2011 0828   BILITOT 0.8 08/26/2011 0828   GFRNONAA >90 08/26/2011 0828   GFRAA >90 08/26/2011 0828   Kelly Rosario 08/26/2011, 10:01 AM

## 2011-08-26 NOTE — Progress Notes (Signed)
Pt complete per Clelia Croft, RN. Will continue to labor down and monitor q 5 min and document q 15 min.

## 2011-08-26 NOTE — Progress Notes (Signed)
IFSE no longer tracing, readjusted, and all connections checked.  Changed to external and attempted to remove IFSE, but unable to remove from scalp.

## 2011-08-26 NOTE — Progress Notes (Signed)
Again attempted to remove IFSE during exam without success. MD aware.

## 2011-08-26 NOTE — Anesthesia Postprocedure Evaluation (Signed)
Anesthesia Post Note  Patient: Kelly Rosario  Procedure(s) Performed: Procedure(s) (LRB): CESAREAN SECTION (N/A)  Anesthesia type: Epidural  Patient location: PACU  Post pain: Pain level controlled  Post assessment: Post-op Vital signs reviewed  Last Vitals:  Filed Vitals:   08/26/11 1715  BP:   Pulse:   Temp: 36.9 C  Resp:     Post vital signs: stable  Level of consciousness: awake  Complications: No apparent anesthesia complications

## 2011-08-26 NOTE — Progress Notes (Signed)
Interpreter service for Nepali used to discuss plan of care.  IUPC replaced, pain assessed, patient denies need for any additional PCEA at this time, and restarting pitocin.  Reminded patient she is not to try to get out of bed with the epidural.  Patient and mother both ask questions and all answered to their satisfaction and understand plan of care

## 2011-08-26 NOTE — Progress Notes (Signed)
Kelly Rosario is a 21 y.o. G1P0 at [redacted]w[redacted]d.  Subjective: Comfortable w/ epidural  Objective: BP 138/89  Pulse 107  Temp(Src) 99.8 F (37.7 C) (Oral)  Resp 20  Ht 5\' 4"  (1.626 m)  Wt 79.833 kg (176 lb)  BMI 30.21 kg/m2  SpO2 96% I/O last 3 completed shifts: In: -  Out: 3400 [Urine:3400]    FHT:  FHR: 130 bpm, variability: moderate,  accelerations:  Present,  decelerations:  Absent UC:   regular, every 2-3 minutes, MVUs 95-135 in 16 milliUnits/min pitocin SVE:   Dilation: 8 Effacement (%): 90 Station: +1 Exam by:: Dorathy Kinsman, CNM  Labs: Lab Results  Component Value Date   WBC 13.1* 08/23/2011   HGB 11.5* 08/23/2011   HCT 35.2* 08/23/2011   MCV 80.9 08/23/2011   PLT 211 08/23/2011    Assessment / Plan: Induction of labor due to postterm,  progressing slowly on pitocin  Labor: Progressing slowly on Pitocin Preeclampsia:  Will draw new labs Fetal Wellbeing:  Category I Pain Control:  Epidural I/D:  n/a Anticipated MOD:  NSVD  Dashia Caldeira 08/26/2011, 6:57 AM

## 2011-08-26 NOTE — Op Note (Signed)
Procedure: Primary low transverse cesarean section Preoperative diagnosis: Intrauterine pregnancy 41 weeks 5 days gestation, second stage arrest, intrauterine infection Postoperative diagnosis: Same Surgeon: Dr. Scheryl Darter Anesthesia: Epidural Drains: Foley catheter Findings: Liveborn female infant Apgars 8 and 9 Complications: None Counts: Correct  Patient gave written consent for primary cesarean section due to diagnosis of active phase arrest after induction of labor. She was diagnosed with intrauterine infection and was on ampicillin and gentamicin. Patient identification was confirmed and she was brought to the operating room and epidural anesthesia was brought to a surgical level. Abdomen was sterilely prepped and draped. A Foley catheter was in place. #10 blade was used to make a Pfannenstiel incision down to the fascia. Hemostasis was obtained with cautery. The fascial incision was extended with curved Mayo scissors. Fascia was separated from underlying tissue attachments with blunt and sharp dissection. The bellies of the rectus muscles were separated. The peritoneum was entered at the midline and the incision was extended.Alexis retractor was placed. A moist lap pad was used to pack the bowel. A bladder flap was created by scoring across the lower uterine segment with #10 blade. Uterine incision was made and the uterus was entered. Clear fluid was expressed. Incision was extended transversely. Fetal head was deep in the pelvis but was elevated without complications. Fetal head was delivered mouth and nose were cleared with bulb suction. The infant delivered without complications. Infant was vigorous at birth and Apgars were 8 and 9. Infant was liveborn female. Cord was clamped and cut and infant was handed to nursery personnel. A cord blood gas was obtained the specimen was not able to the process. After obtaining cord blood for blood type placenta was delivered and uterine cavity was explored.  Patient received 40 units of IV Pitocin. The uterus firmed and hemostasis was good. The uterine incision was closed with a running locking suture was 0 Vicryl. An imbricating layer followed with 0 Vicryl. Hemostasis was seen. Both adnexa were seen and were normal. The peritoneum was closed with a running suture with 2-0 Vicryl. The fascia was closed with running suture with 0 Vicryl. Good hemostasis was seen in the incision and incision was irrigated. Skin was closed with a running subcuticular suture of 4-0 Vicryl. Sterile dressing was applied. The patient tolerated the procedure well without complications. She was brought in stable condition to the recovery room.  Dr. Scheryl Darter 08/26/2011

## 2011-08-26 NOTE — Progress Notes (Signed)
Kelly Rosario is a 21 y.o. G1P0000 at [redacted]w[redacted]d by ultrasound admitted for induction of labor due to Post dates. Due date 08/13/11.  Subjective:Good pain control   Objective: BP 147/84  Pulse 117  Temp(Src) 101.3 F (38.5 C) (Axillary)  Resp 18  Ht 5\' 4"  (1.626 m)  Wt 79.833 kg (176 lb)  BMI 30.21 kg/m2  SpO2 96% I/O last 3 completed shifts: In: -  Out: 3400 [Urine:3400] Total I/O In: 618.4 [P.O.:240; I.V.:378.4] Out: 800 [Urine:800]  FHT:  FHR: 145 bpm, variability: moderate,  accelerations:  Present,  decelerations:  Absent UC:   regular, every 3 minutes SVE:   Dilation: 10 Effacement (%): 100 Station: +2 Exam by:: Philipp Deputy, CNM  Labs: Lab Results  Component Value Date   WBC 19.6* 08/26/2011   HGB 10.8* 08/26/2011   HCT 32.8* 08/26/2011   MCV 80.6 08/26/2011   PLT 151 08/26/2011    Assessment / Plan: Second stage arrest  Labor: 2nd stage arrest Preeclampsia:  no signs or symptoms of toxicity Fetal Wellbeing:  Category I Pain Control:  Epidural I/D:  Fever on on antibiotics Anticipated MOD:  [place] [place] interpreter explained indication for cesarean section, The risk of bleeding, infection, abdominal organ damage and anesthesia was discussed and her questions were answered. Consent was signed.   Tahj Njoku 08/26/2011, 3:23 PM

## 2011-08-26 NOTE — Progress Notes (Signed)
When RN entered room, patient sitting on side of bed.  Discussed with patient that she could not sit on side of bed.  Patient's linen changed, gown changed, given warm washcloth for face/hands, teeth brushed by patient. Patient changed to high fowlers.

## 2011-08-26 NOTE — Progress Notes (Signed)
Kelly Rosario is a 21 y.o. G1P0000 at [redacted]w[redacted]d   Subjective: Just had epid redosed; now not feeling ctx; pushing x 2 hours now  Objective: BP 153/93  Pulse 129  Temp(Src) 100.4 F (38 C) (Axillary)  Resp 18  Ht 5\' 4"  (1.626 m)  Wt 79.833 kg (176 lb)  BMI 30.21 kg/m2  SpO2 96% I/O last 3 completed shifts: In: -  Out: 3400 [Urine:3400] Total I/O In: 311.6 [P.O.:120; I.V.:191.6] Out: 725 [Urine:725]  FHT:  FHR: 140 bpm, variability: moderate,  accelerations:  Present,  decelerations:  Absent; occ mi variables UC:   regular, every 2-4 minutes SVE:   Dilation: 10 Effacement (%): 100 Station: +2 Exam by:: Kelly Rosario, CNM  Labs: Lab Results  Component Value Date   WBC 19.6* 08/26/2011   HGB 10.8* 08/26/2011   HCT 32.8* 08/26/2011   MCV 80.6 08/26/2011   PLT 151 08/26/2011    Assessment / Plan: Pushing x 2 hours Elevated temp  Will allow to labor down for an hour since pt not feeling ctx with redose Tylenol, Amp and Gent for temp   Kelly Rosario 08/26/2011, 2:00 PM

## 2011-08-26 NOTE — Progress Notes (Signed)
Kelly Rosario is a 21 y.o. G1P0 at [redacted]w[redacted]d  Subjective: Doing well. No complaints at this time. No urge to push  Objective: BP 131/90  Pulse 97  Temp(Src) 97.9 F (36.6 C) (Oral)  Resp 20  Ht 5\' 4"  (1.626 m)  Wt 79.833 kg (176 lb)  BMI 30.21 kg/m2  SpO2 96% I/O last 3 completed shifts: In: -  Out: 850 [Urine:850]    FHT:  FHR: 140s bpm, variability: moderate,  accelerations:  Present,  decelerations:  Absent UC:   irregular,  SVE:   Dilation: 6 Effacement (%): 80 Station: 0 Exam by:: Dorathy Kinsman, CNM  Labs: Lab Results  Component Value Date   WBC 13.1* 08/23/2011   HGB 11.5* 08/23/2011   HCT 35.2* 08/23/2011   MCV 80.9 08/23/2011   PLT 211 08/23/2011    Assessment / Plan: Augmentation of labor, progressing well. Restarting Pitocin. IUCP replaced.   Labor: Progressing normally Fetal Wellbeing:  Category I Pain Control:  Epidural I/D:  n/a Anticipated MOD:  NSVD  Briteny Fulghum 08/26/2011, 2:04 AM

## 2011-08-26 NOTE — Transfer of Care (Signed)
Immediate Anesthesia Transfer of Care Note  Patient: Kelly Rosario  Procedure(s) Performed: Procedure(s) (LRB): CESAREAN SECTION (N/A)  Patient Location: PACU  Anesthesia Type: Epidural  Level of Consciousness: awake and alert   Airway & Oxygen Therapy: Patient Spontanous Breathing  Post-op Assessment: Report given to PACU RN and Post -op Vital signs reviewed and stable  Post vital signs: Reviewed and stable  Complications: No apparent anesthesia complications

## 2011-08-27 ENCOUNTER — Encounter (HOSPITAL_COMMUNITY): Payer: Self-pay | Admitting: Obstetrics & Gynecology

## 2011-08-27 LAB — CBC
HCT: 25.4 % — ABNORMAL LOW (ref 36.0–46.0)
Hemoglobin: 8.4 g/dL — ABNORMAL LOW (ref 12.0–15.0)
MCV: 80.1 fL (ref 78.0–100.0)
Platelets: 145 10*3/uL — ABNORMAL LOW (ref 150–400)
RBC: 3.17 MIL/uL — ABNORMAL LOW (ref 3.87–5.11)
WBC: 16.1 10*3/uL — ABNORMAL HIGH (ref 4.0–10.5)

## 2011-08-27 NOTE — Progress Notes (Signed)
UR chart review completed.  

## 2011-08-27 NOTE — Addendum Note (Signed)
Addendum  created 08/27/11 1410 by Algis Greenhouse, CRNA   Modules edited:Notes Section

## 2011-08-27 NOTE — Anesthesia Postprocedure Evaluation (Signed)
  Anesthesia Post Note  Patient: Kelly Rosario  Procedure(s) Performed: Procedure(s) (LRB): CESAREAN SECTION (N/A)  Anesthesia type: Epidural  Patient location: Mother/Baby  Post pain: Pain level controlled  Post assessment: Post-op Vital signs reviewed  Last Vitals:  Filed Vitals:   08/27/11 1233  BP: 111/77  Pulse: 94  Temp: 36.3 C  Resp: 18    Post vital signs: Reviewed  Level of consciousness:alert  Complications: No apparent anesthesia complications

## 2011-08-27 NOTE — Progress Notes (Signed)
Post Partum Day 1  Subjective: Complains of some nausea w/ food, and dizziness w/ change in position (sitting or lying to standing). Pain well controlled. Voiding and passing flatus. Denies CP, SOB, LE pain.   Objective: Blood pressure 127/90, pulse 112, temperature 98.3 F (36.8 C), temperature source Oral, resp. rate 20, height 5\' 4"  (1.626 m), weight 79.833 kg (176 lb), SpO2 100.00%, unknown if currently breastfeeding.  Physical Exam:  General: alert, cooperative and no distress Lochia: appropriate Uterine Fundus: moderately firm Incision: healing well DVT Evaluation: No evidence of DVT seen on physical exam. Trace LE edema   Basename 08/27/11 0545 08/26/11 1745  HGB 8.4* 10.2*  HCT 25.4* 30.7*    Assessment/Plan: Social Work consult and Contraception Nexplanon. Bottle feeding.  - DC ABX after 24hrs - nursing to assist w/ ambulation - incentive spirometry   LOS: 4 days   MERRELL, DAVID 08/27/2011, 8:45 AM

## 2011-08-28 DIAGNOSIS — Z98891 History of uterine scar from previous surgery: Secondary | ICD-10-CM

## 2011-08-28 MED ORDER — SENNOSIDES-DOCUSATE SODIUM 8.6-50 MG PO TABS: 2.0000 | ORAL_TABLET | Freq: Two times a day (BID) | ORAL | Status: AC

## 2011-08-28 MED ORDER — IBUPROFEN 600 MG PO TABS: 600.0000 mg | ORAL_TABLET | Freq: Four times a day (QID) | ORAL | Status: AC | PRN

## 2011-08-28 MED ORDER — OXYCODONE-ACETAMINOPHEN 5-325 MG PO TABS: 1.0000 | ORAL_TABLET | ORAL | Status: AC | PRN

## 2011-08-28 MED ORDER — SENNOSIDES-DOCUSATE SODIUM 8.6-50 MG PO TABS: 2.0000 | ORAL_TABLET | Freq: Two times a day (BID) | ORAL | Status: DC
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
## 2011-08-28 NOTE — Discharge Summary (Signed)
Obstetric Discharge Summary Reason for Admission: induction of labor Prenatal Procedures: none Intrapartum Procedures: cesarean: low cervical, transverse Postpartum Procedures: none Complications-Operative and Postpartum: none Hemoglobin  Date Value Range Status  08/27/2011 8.4* 12.0-15.0 (g/dL) Final     DELTA CHECK NOTED     REPEATED TO VERIFY     HCT  Date Value Range Status  08/27/2011 25.4* 36.0-46.0 (%) Final    Physical Exam:  General: alert, cooperative and no distress Lochia: appropriate Uterine Fundus: firm Incision: healing well, no significant drainage, no dehiscence, no significant erythema DVT Evaluation: No evidence of DVT seen on physical exam. Negative Homan's sign.  Discharge Diagnoses: Term Pregnancy-delivered  Discharge Information: Date: 08/28/2011 Activity: pelvic rest Diet: routine Medications: PNV, Ibuprofen, Colace and Percocet Condition: stable Instructions: refer to practice specific booklet Discharge to: home Follow-up Information    Follow up with [facility] [place] in 6 weeks.   Contact information:   [address] [employer] [place] [address]          Newborn Data: Live born female  Birth Weight: 8 lb 6.2 oz (3805 g) APGAR: 8, 9  Home with mother.  Kelly Rosario 08/28/2011, 9:47 AM

## 2011-08-28 NOTE — Progress Notes (Signed)
Late entry for 08/27/11  1600 Used international interpreter on phone to explain PKU, CHD, Hep. B and Tdap. Also asked if there were questions or concerns. Explained plan of care for mom and baby.

## 2011-08-28 NOTE — Progress Notes (Signed)
Post Partum Day 2 Subjective: up ad lib, voiding, tolerating PO and complaining of feeling constipated  Objective: Blood pressure 119/80, pulse 98, temperature 98.2 F (36.8 C), temperature source Oral, resp. rate 18, height 5\' 4"  (1.626 m), weight 79.833 kg (176 lb), SpO2 97.00%, unknown if currently breastfeeding.  Physical Exam:  General: alert, cooperative, appears stated age and no distress Lochia: appropriate Uterine Fundus: firm Incision: healing well, no significant drainage, no dehiscence, no significant erythema DVT Evaluation: No evidence of DVT seen on physical exam.   Basename 08/27/11 0545 08/26/11 1745  HGB 8.4* 10.2*  HCT 25.4* 30.7*    Assessment/Plan: Plan for discharge tomorrow, Social Work consult and Contraception nexplanon Senna-Docusate increased to 2tab BID   LOS: 5 days   MERRELL, DAVID 08/28/2011, 7:53 AM

## 2011-08-28 NOTE — Discharge Instructions (Signed)

## 2011-08-28 NOTE — Progress Notes (Signed)
Used international interpreter for discharge teaching, incision care, baby care, and medications.

## 2011-08-29 NOTE — Progress Notes (Signed)
Clinical Social Work Department BRIEF PSYCHOSOCIAL ASSESSMENT 08/29/2011  Patient:  [facility]     Account Number:  [id number]     Admit date:  08/23/2011  Clinical Social Worker:  Andy Gauss  Date/Time:  08/28/2011 10:30 AM  Referred by:  Physician  Date Referred:  08/28/2011 Referred for  Other - See comment   Other Referral:   Resources   Interview type:  Patient Other interview type:    PSYCHOSOCIAL DATA Living Status:  FAMILY Admitted from facility:   Level of care:   Primary support name:   Primary support relationship to patient:  PARENT Degree of support available:   Involved    CURRENT CONCERNS Current Concerns  Other - See comment   Other Concerns:    SOCIAL WORK ASSESSMENT / PLAN Pt had questions about applying for [facility] and Medicaid (for the infant).  Pt was given the number to [facility] by the Health Department liaison.  Since pt has Medicaid, the infant will automatically received the benefits.  Sw instructed pt to call her Medicaid worker to inform of delivery.  Birth registrar representatives automatically apply for the infants SS# when the birth certificate is complete.  Pt asked questions about food stamps and Sw advised her to apply at department of social services.  Pt's mom was present as a support person.   Assessment/plan status:  No Further Intervention Required Other assessment/ plan:   Information/referral to community resources:   Department of Engineer, structural Department    PATIENT'S/FAMILY'S RESPONSE TO PLAN OF CARE:  Pt thanked Sw for resources.

## 2011-09-02 NOTE — Progress Notes (Signed)
I have seen and examined this patient and I agree with the above. Cam Hai 1:59 PM 09/02/2011

## 2011-10-20 ENCOUNTER — Encounter: Payer: Medicaid Other | Admitting: Obstetrics & Gynecology

## 2011-10-21 ENCOUNTER — Encounter: Payer: Medicaid Other | Admitting: Family Medicine

## 2012-02-17 NOTE — Progress Notes (Signed)
I have seen @PTNAME @ and examination done.  I agree with documentation and plan as noted in resident/PA's note. Lyzbeth Genrich V 02/17/2012 3:49 PM

## 2013-04-16 IMAGING — CR DG CHEST 1V
1 series · 1 of 1 positions shown · non-contrast
Comparison: None

CLINICAL DATA: History of abnormal chest x-ray.

CHEST - 1 VIEW

[w chest pa]
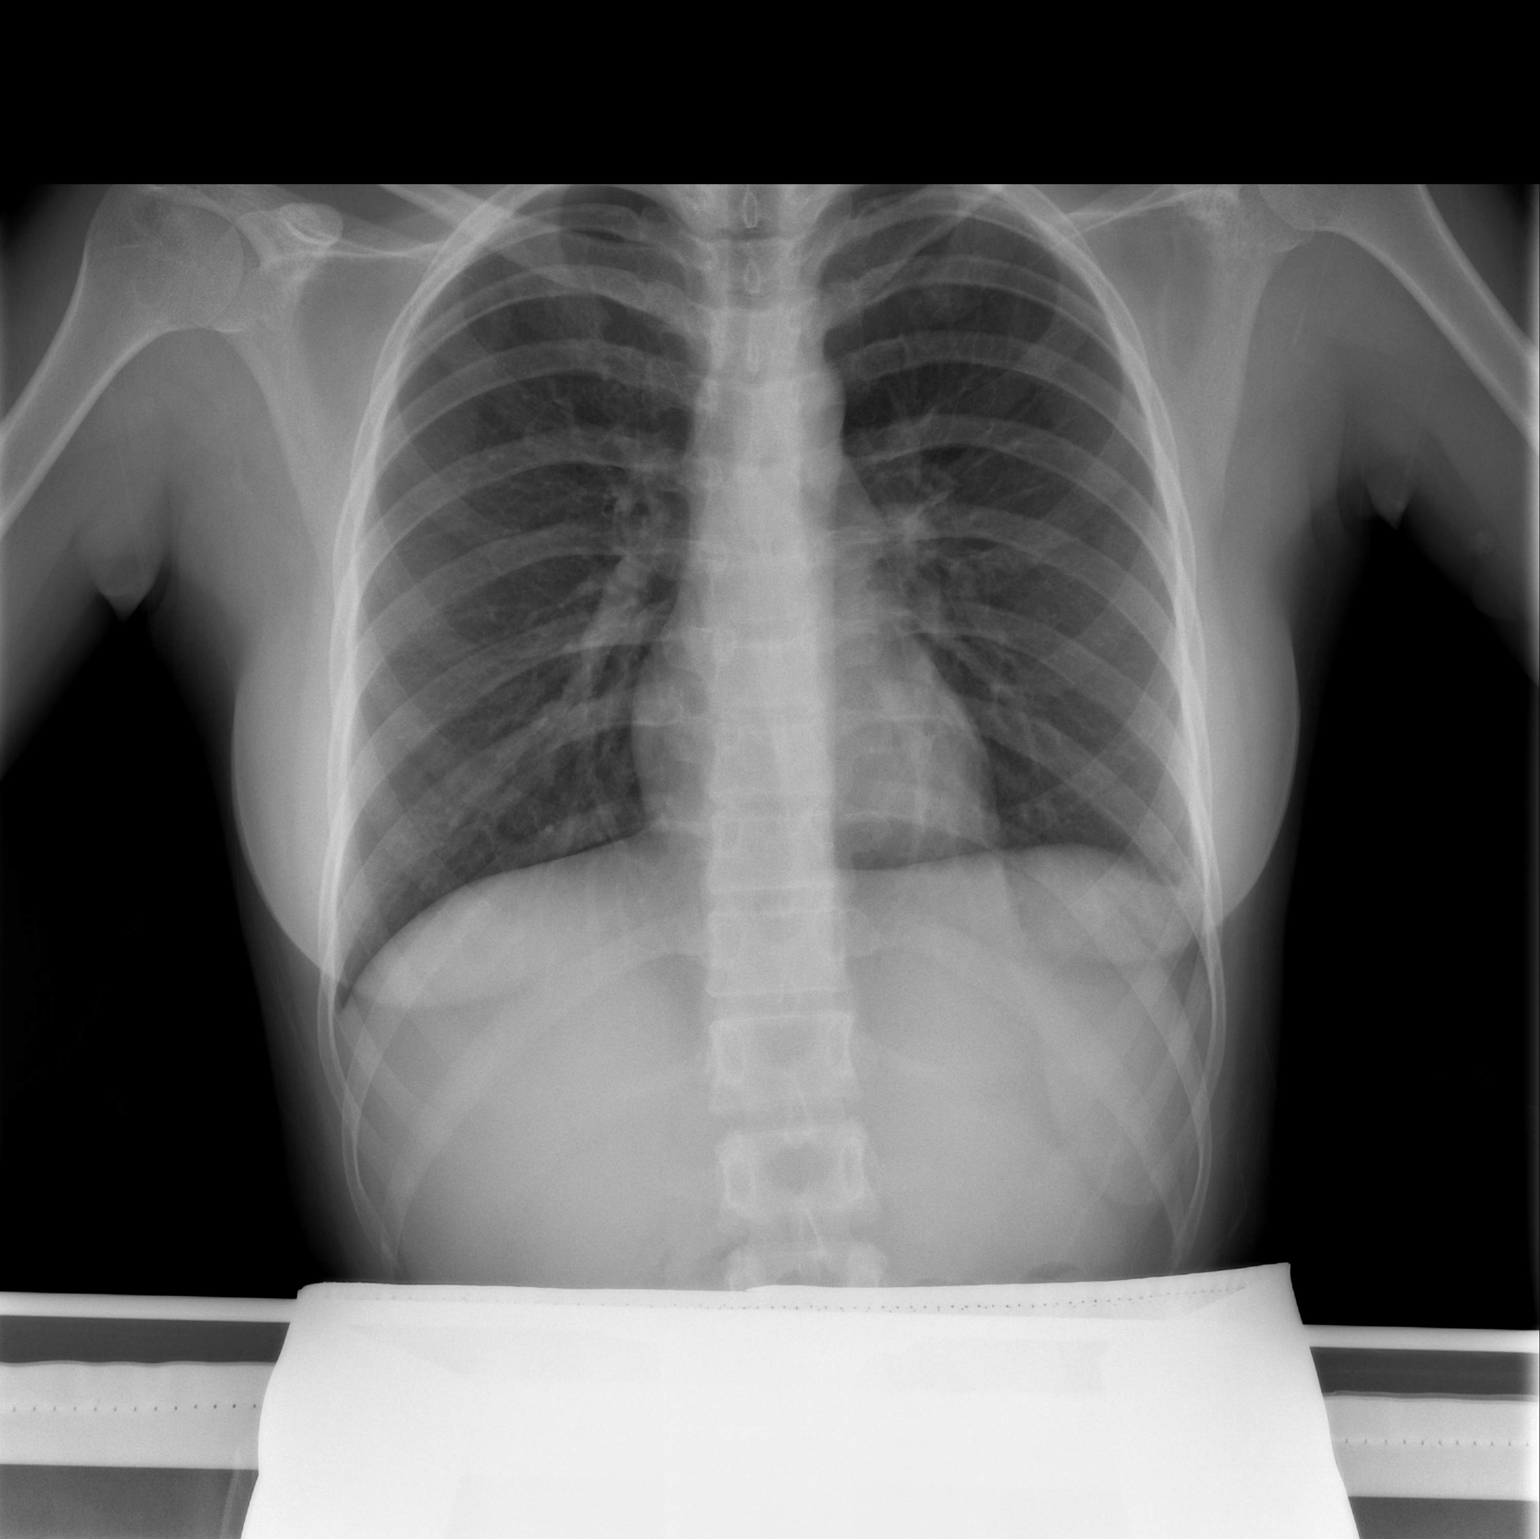

[1 of 1 positions shown; findings below may reference images not displayed]

FINDINGS: The cardiac silhouette, mediastinal and hilar contours
are within normal limits.  The lungs are clear.  The bony thorax is
intact.
IMPRESSION: No acute cardiopulmonary findings.

## 2013-09-10 IMAGING — US US ABDOMEN COMPLETE
1 series · 14 of 25 positions shown · non-contrast
Comparison: None.

CLINICAL DATA: Right upper abdominal pain

COMPLETE ABDOMINAL ULTRASOUND

[Series 1: us abdomen complete · 14 of 81 slices shown]
[im 1/81]
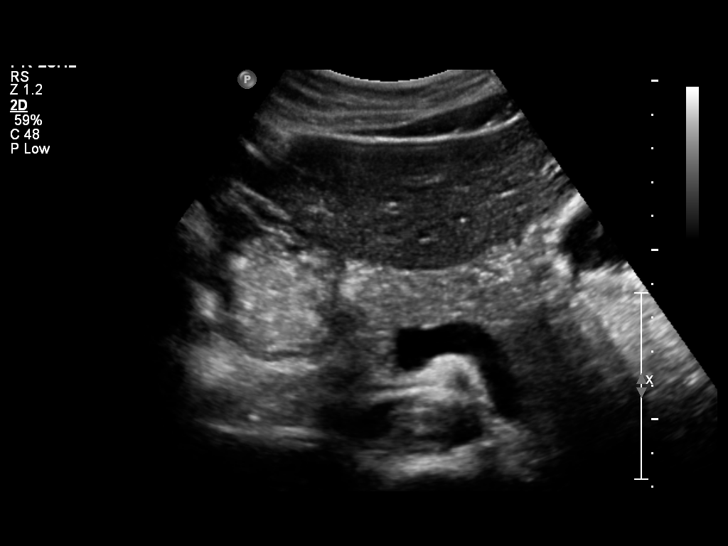
[im 7/81]
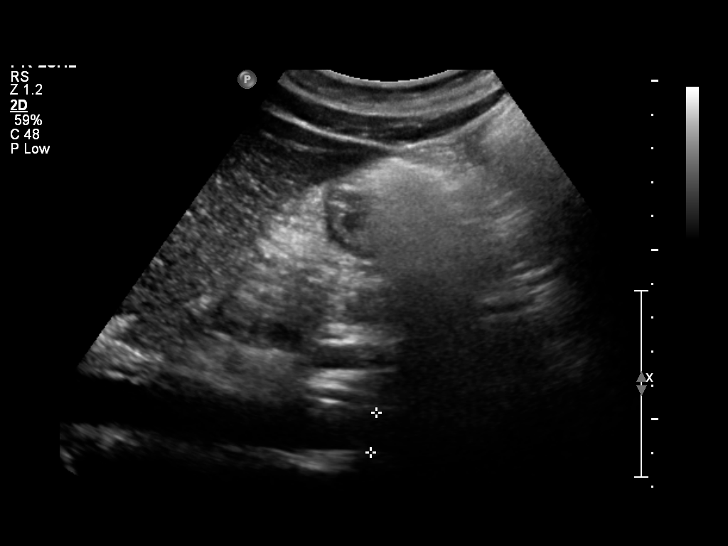
[im 14/81]
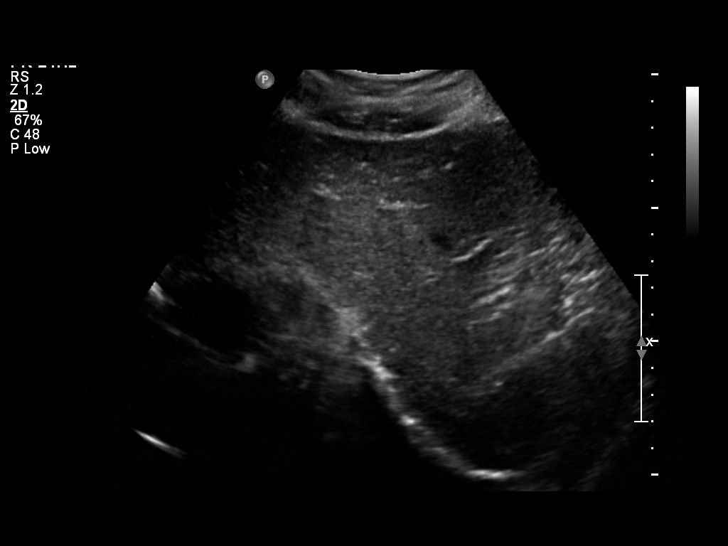
[im 21/81]
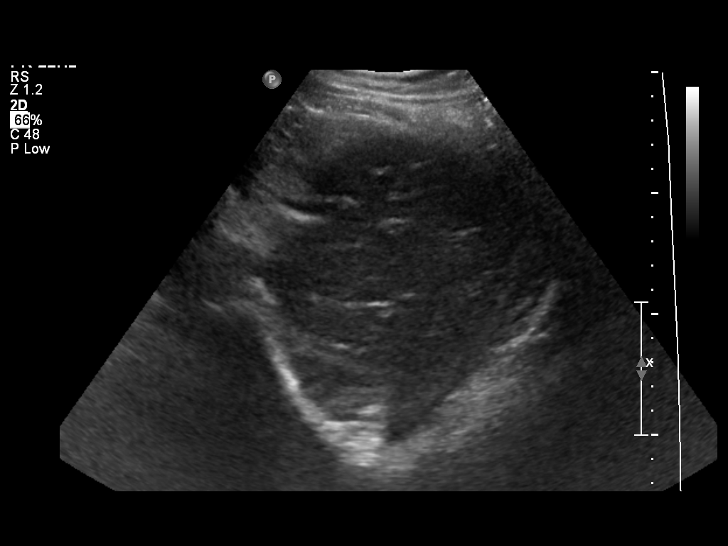
[im 27/81]
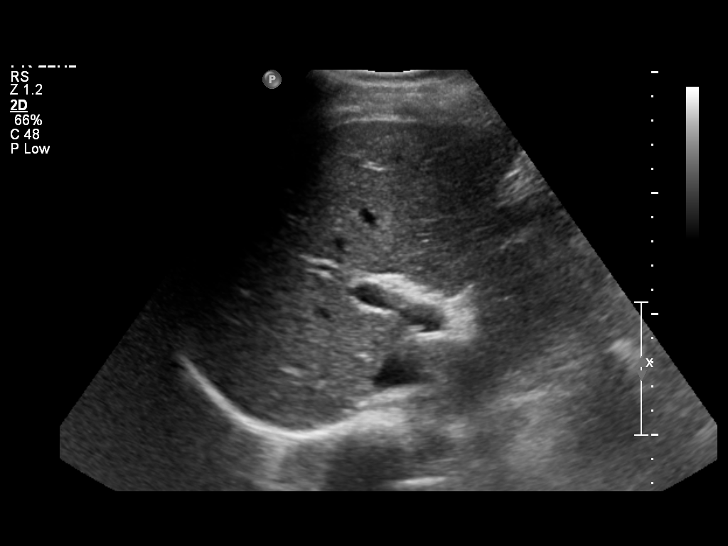
[im 31/81]
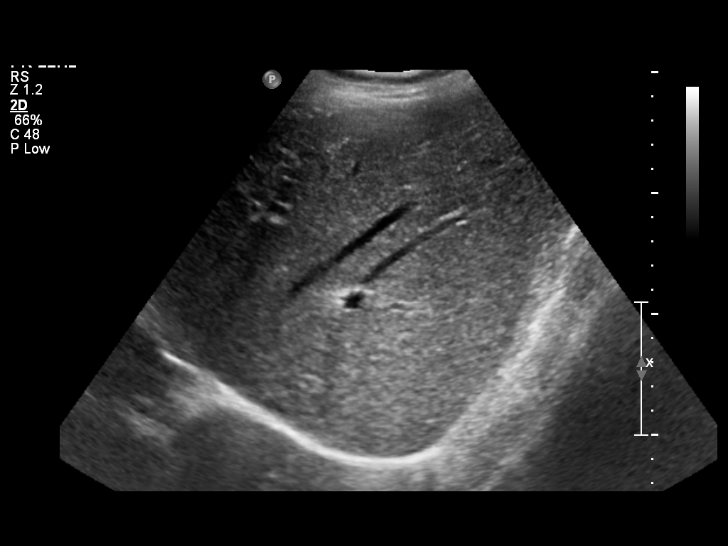
[im 37/81]
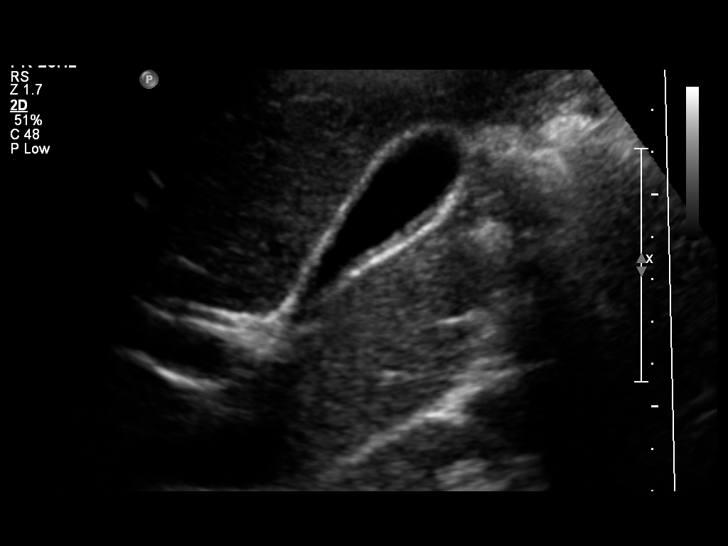
[im 44/81]
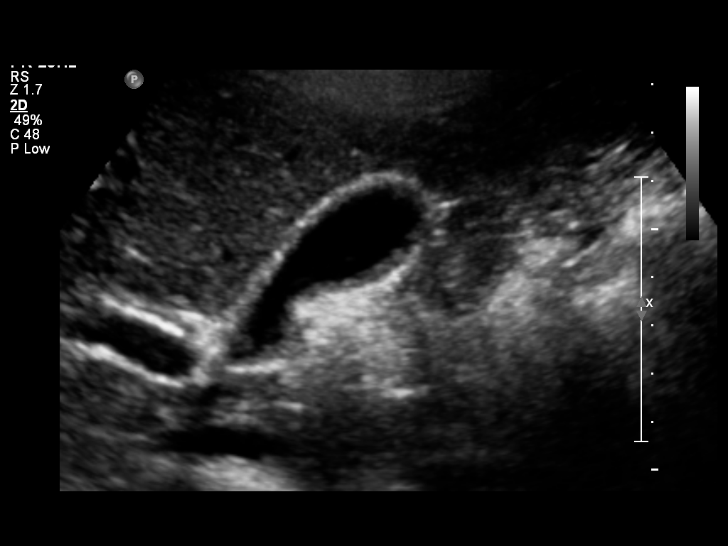
[im 51/81]
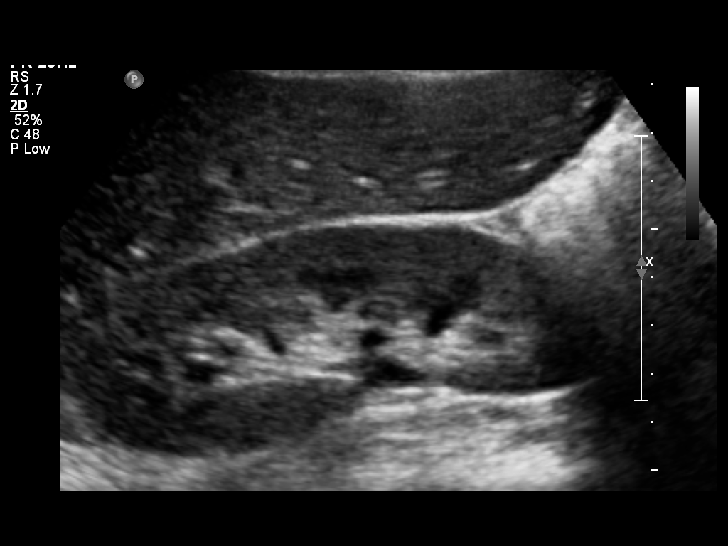
[im 54/81]
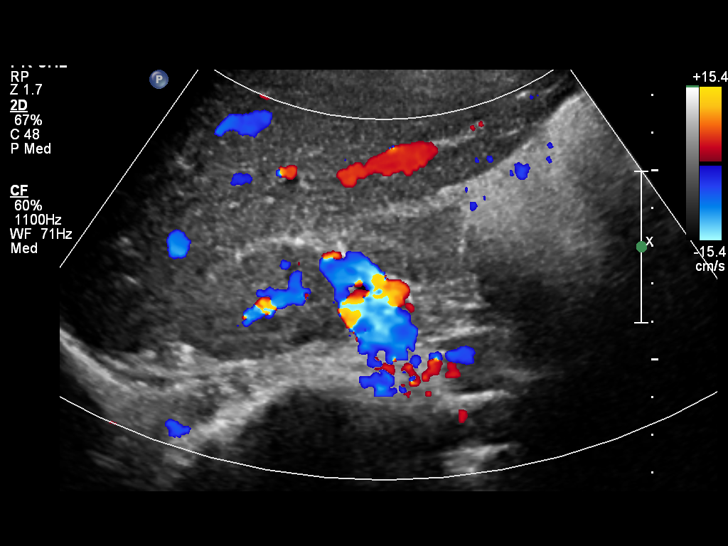
[im 61/81]
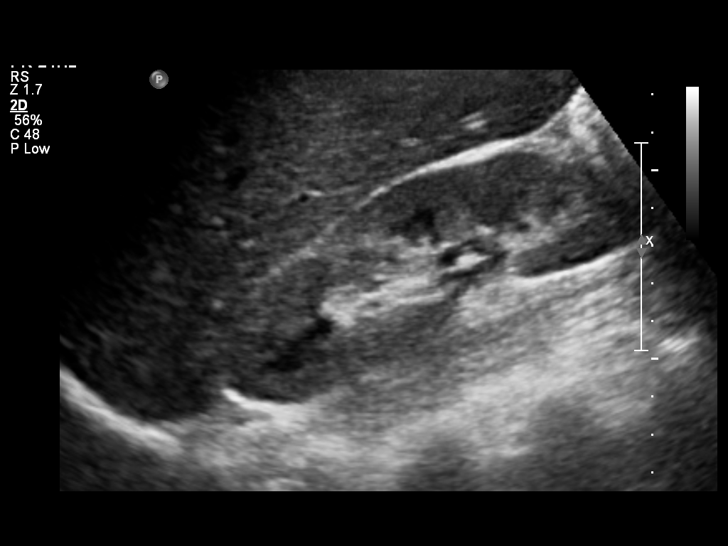
[im 67/81]
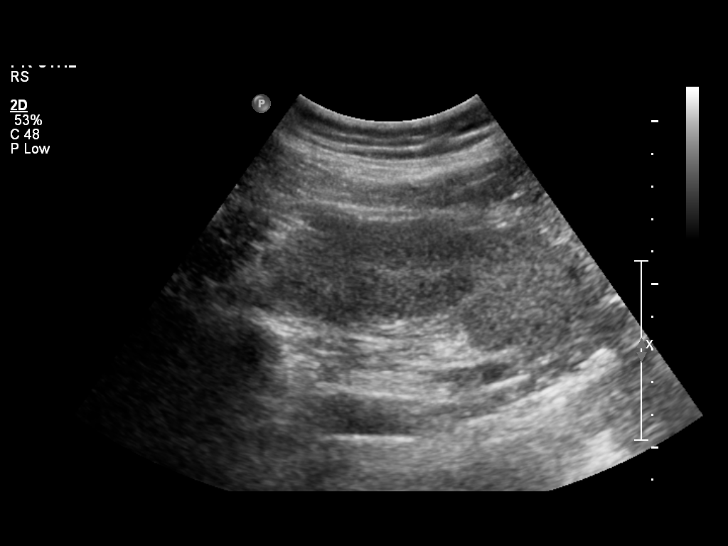
[im 74/81]
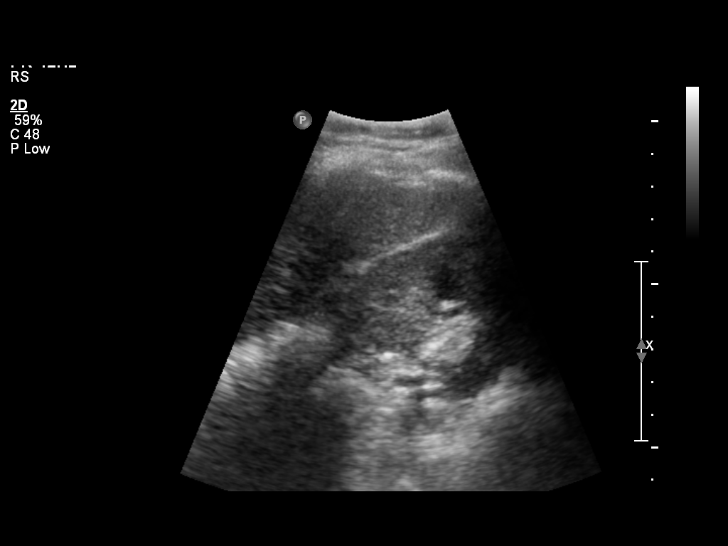
[im 81/81]
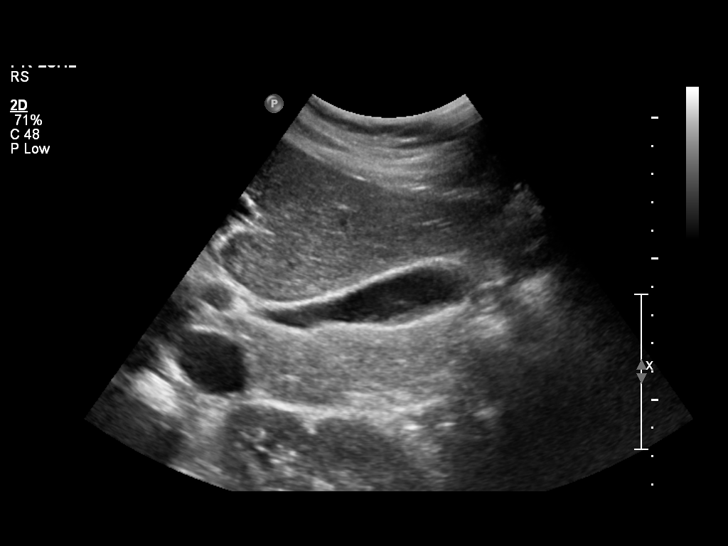

[14 of 25 positions shown; findings below may reference images not displayed]

FINDINGS: Gallbladder:  Borderline gallbladder wall thickening up to 4 mm.
No gallstones or pericholecystic fluid.  Sonographer reports no
sonographic Murphy's sign.

Common bile duct:  2 mm in diameter, unremarkable

Liver:  No focal lesion identified.  Within normal limits in
parenchymal echogenicity.

IVC:  Appears normal.

Pancreas:  No focal abnormality seen.

Spleen:  11.3 cm craniocaudal length, unremarkable

Right Kidney:  12.1 cm. No hydronephrosis.  Well-preserved cortex.
Normal size and parenchymal echotexture without focal
abnormalities.

Left Kidney:  12.6 cm. No hydronephrosis.  Well-preserved cortex.
Normal size and parenchymal echotexture without focal
abnormalities.

Abdominal aorta:  No aneurysm identified.
IMPRESSION: 1.  Borderline gallbladder wall thickening with no other
sonographic findings to suggest cholecystitis or biliary
obstruction..

## 2013-11-19 IMAGING — US US FETAL BPP W/O NONSTRESS
1 series · 9 of 9 positions shown · non-contrast
Comparison: none

[Series 1: us fetal bpp w/o nonstress · non-contrast · 9 acquisitions, 9 frames shown]
[im 1/9]
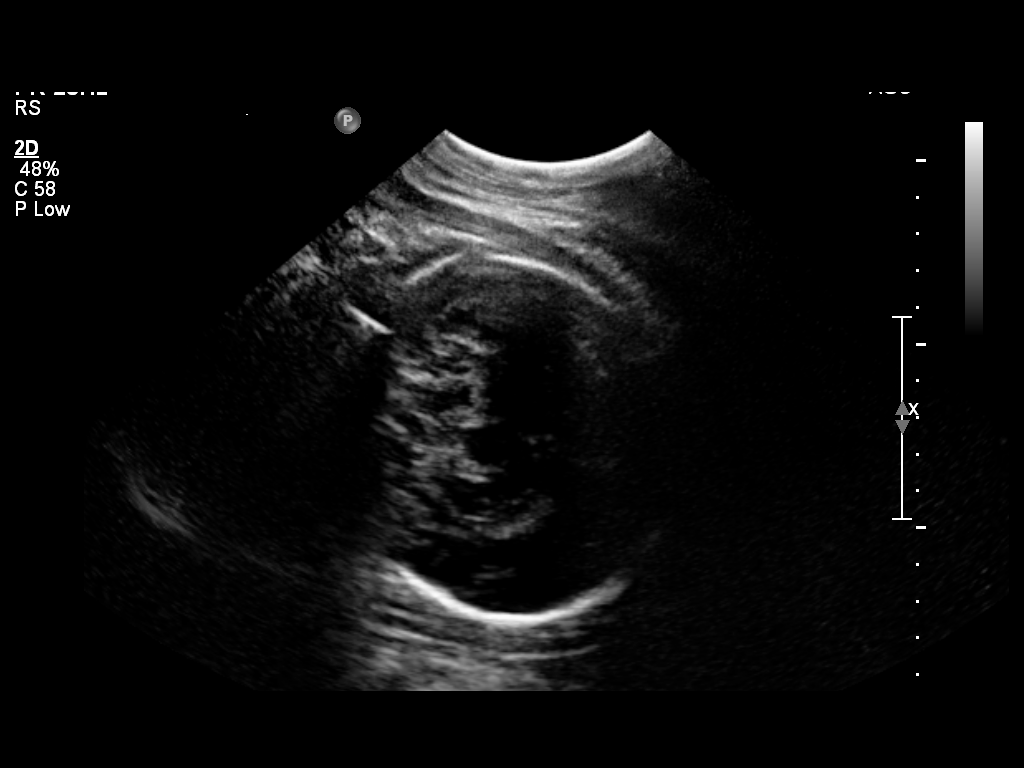
[im 2/9]
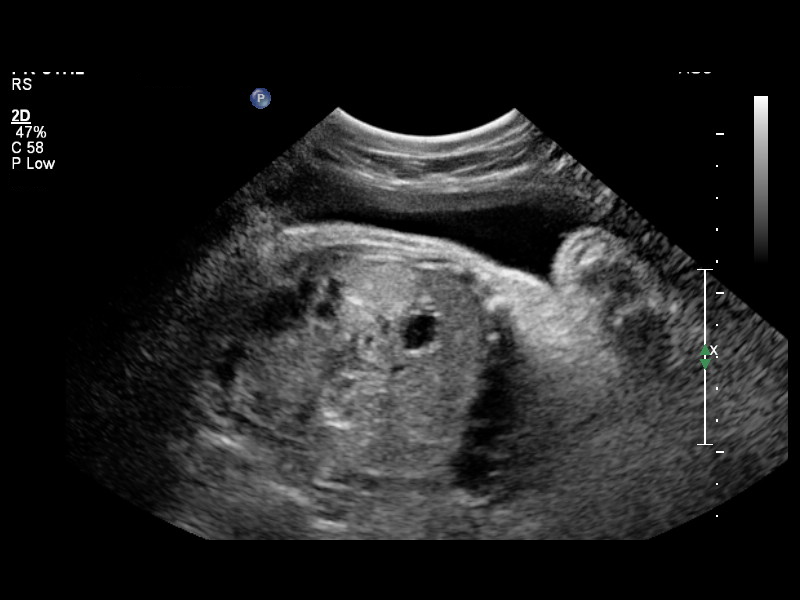
[im 3/9]
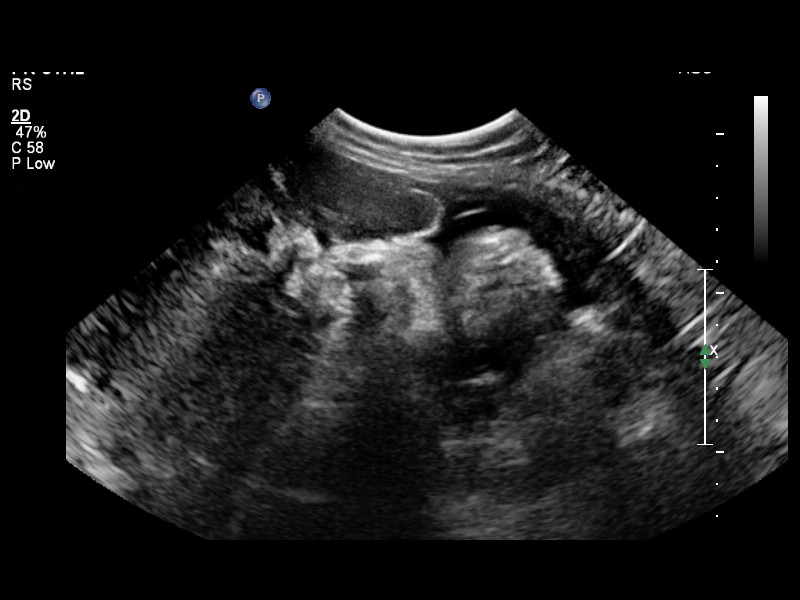
[im 4/9]
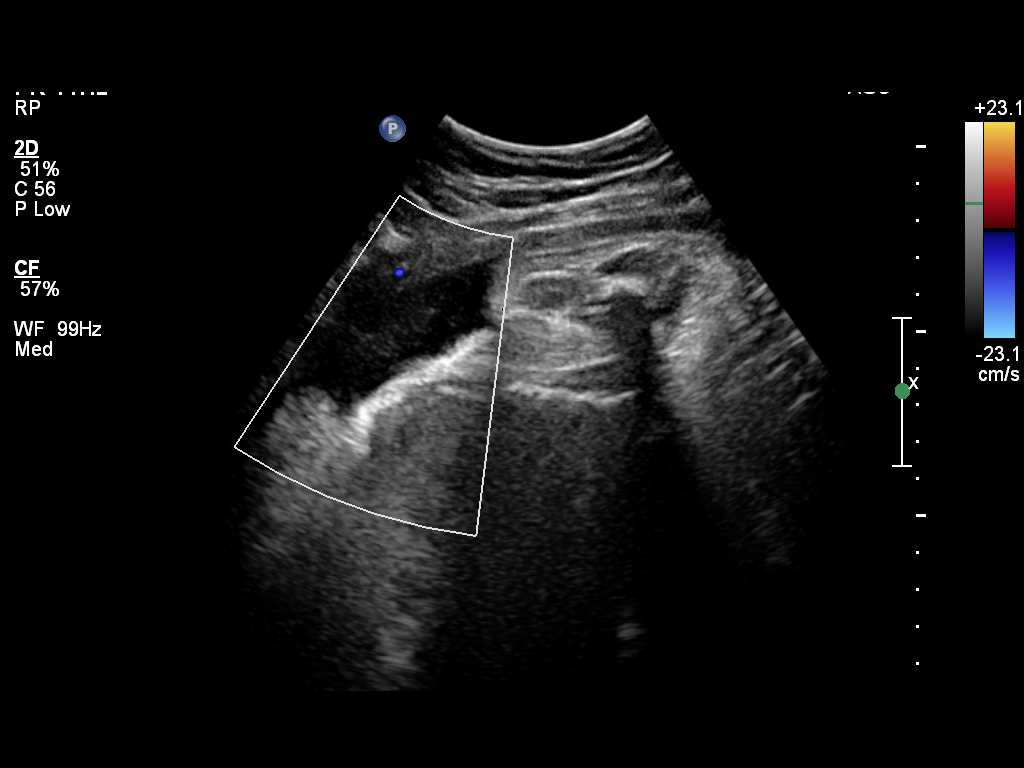
[im 5/9]
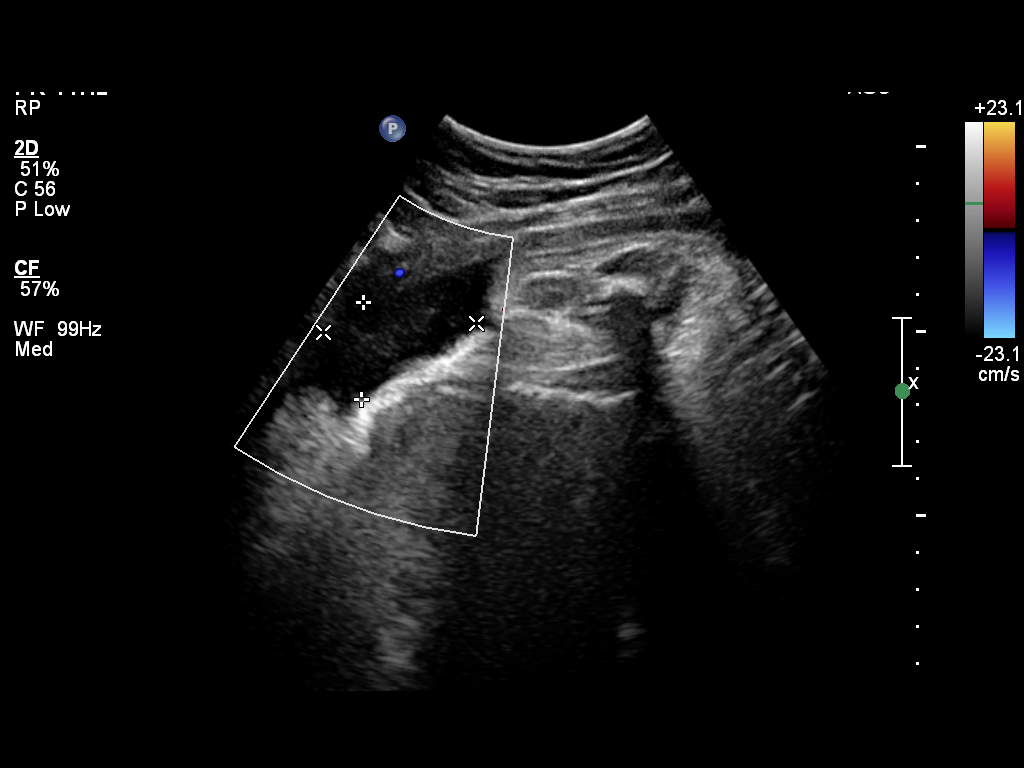
[im 6/9]
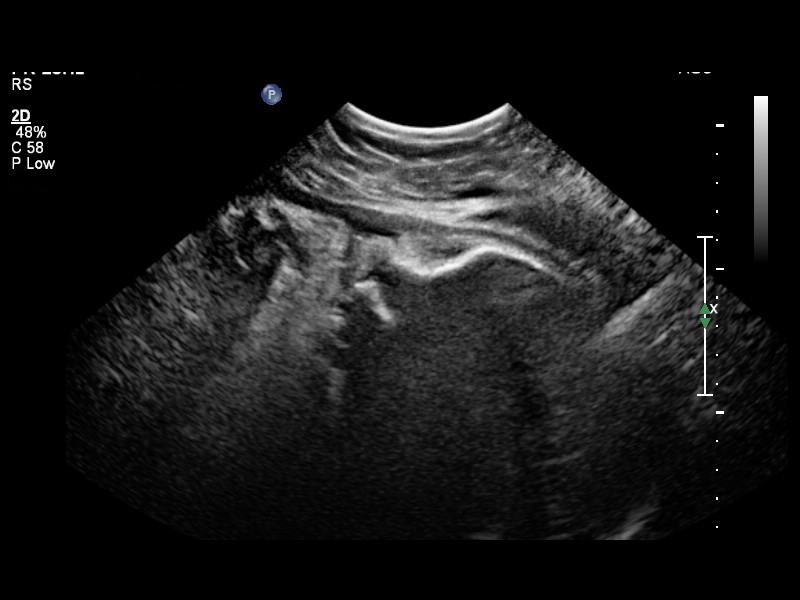
[im 7/9]
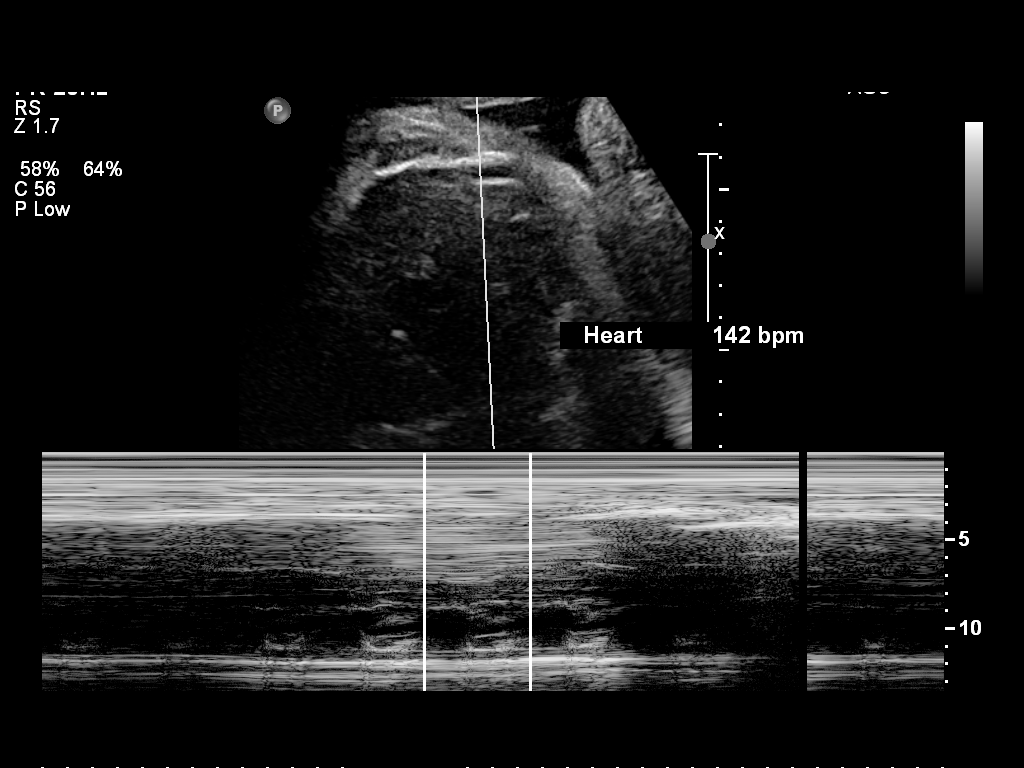
[im 8/9]
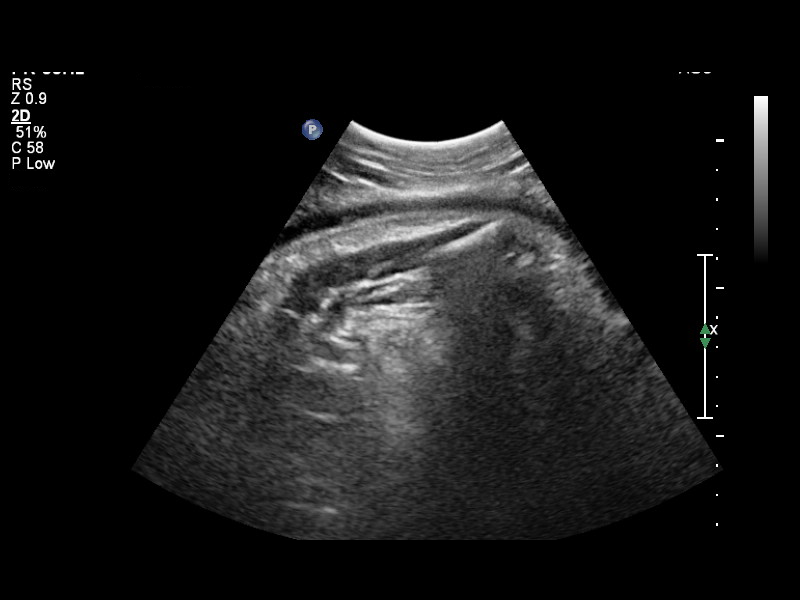
[im 9/9]
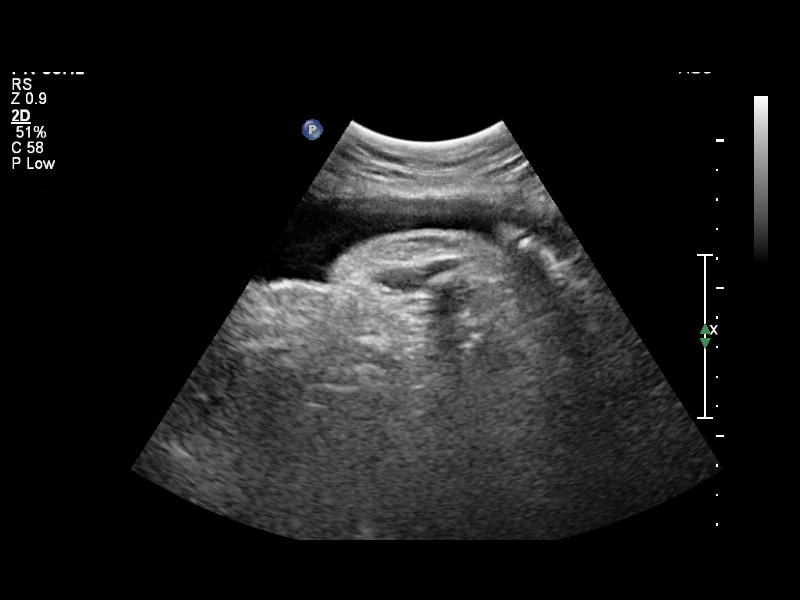

[9 of 9 positions shown; findings below may reference images not displayed]

OBSTETRICS REPORT
                      (Signed Final 08/18/2011 [DATE])

 Order#:         [id number]
Procedures

Indications

 Assess fetal well being
 Postdate pregnancy (40-42 weeks)
Fetal Evaluation

 Fetal Heart Rate:  142                         bpm
 Cardiac Activity:  Observed
 Presentation:      Cephalic

 Comment:    BPP [DATE] in 8 minutes.

 Amniotic Fluid
 AFI FV:      Subjectively within normal limits
Biophysical Evaluation

 Amniotic F.V:   Pocket => 2 cm two         F. Tone:        Observed
                 planes
 F. Movement:    Observed                   Score:          [DATE]
 F. Breathing:   Observed
Gestational Age

 LMP:           44w 3d       Date:   10/11/10                 EDD:   07/18/11
 Best:          40w 5d    Det. By:   U/S (03/18/11)           EDD:   08/13/11
Impression

 Single live IUP in cephalic presentation.  BPP [DATE].

 questions or concerns.

## 2014-03-20 ENCOUNTER — Encounter (HOSPITAL_COMMUNITY): Payer: Self-pay | Admitting: Obstetrics & Gynecology

## 2019-08-15 ENCOUNTER — Ambulatory Visit: Payer: Self-pay | Attending: Internal Medicine

## 2019-08-15 DIAGNOSIS — Z23 Encounter for immunization: Secondary | ICD-10-CM

## 2019-08-15 NOTE — Progress Notes (Signed)
   Covid-19 Vaccination Clinic  Name:  Kelly Rosario    MRN: [id number] DOB: [birth year]  08/15/2019  Kelly Rosario was observed post Covid-19 immunization for 15 minutes without incident. She was provided with Vaccine Information Sheet and instruction to access the V-Safe system.   Kelly Rosario was instructed to call 911 with any severe reactions post vaccine: Marland Kitchen Difficulty breathing  . Swelling of face and throat  . A fast heartbeat  . A bad rash all over body  . Dizziness and weakness   Immunizations Administered    Name Date Dose VIS Date Route   Pfizer COVID-19 Vaccine 08/15/2019  9:21 AM 0.3 mL 04/29/2019 Intramuscular   Manufacturer: [employer], [employer]   Lot: [id number]   NDC: [address]-2

## 2019-09-06 ENCOUNTER — Ambulatory Visit: Payer: Self-pay | Attending: Internal Medicine

## 2019-09-06 DIAGNOSIS — Z23 Encounter for immunization: Secondary | ICD-10-CM

## 2019-09-06 NOTE — Progress Notes (Signed)
   Covid-19 Vaccination Clinic  Name:  Kelly Rosario    MRN: [id number] DOB: 16-Mar-1991  09/06/2019  [name] was observed post Covid-19 immunization for 15 minutes without incident. She was provided with Vaccine Information Sheet and instruction to access the V-Safe system.   [name] was instructed to call 911 with any severe reactions post vaccine: Marland Kitchen Difficulty breathing  . Swelling of face and throat  . A fast heartbeat  . A bad rash all over body  . Dizziness and weakness   Immunizations Administered    Name Date Dose VIS Date Route   Pfizer COVID-19 Vaccine 09/06/2019  1:36 PM 0.3 mL 07/13/2018 Intramuscular   Manufacturer: [employer], [employer]   Lot: [id number]   NDC: [address]-6

## 2019-09-07 ENCOUNTER — Ambulatory Visit: Payer: Self-pay

## 2021-05-07 ENCOUNTER — Other Ambulatory Visit: Payer: Self-pay

## 2021-05-07 ENCOUNTER — Emergency Department (HOSPITAL_COMMUNITY)
Admission: EM | Admit: 2021-05-07 | Payer: BLUE CROSS/BLUE SHIELD | Source: Home / Self Care | Attending: Student | Admitting: Student

## 2021-05-07 NOTE — ED Notes (Signed)
Wrong patient registered for triage, pt name placed OTF

## 2023-05-04 ENCOUNTER — Ambulatory Visit (INDEPENDENT_AMBULATORY_CARE_PROVIDER_SITE_OTHER): Payer: BLUE CROSS/BLUE SHIELD | Admitting: Dermatology

## 2023-05-04 ENCOUNTER — Encounter: Payer: Self-pay | Admitting: Dermatology

## 2023-05-04 DIAGNOSIS — L811 Chloasma: Secondary | ICD-10-CM | POA: Diagnosis not present

## 2023-05-04 MED ORDER — SAFETY SEAL MISCELLANEOUS MISC: 0 refills | Status: AC

## 2023-05-04 NOTE — Progress Notes (Signed)
   New Patient Visit   Subjective  Kelly Rosario is a 32 y.o. female who presents for the following: New Pt - Dark Spot  Patient states she has dark spots located at the face that she would like to have examined. Patient reports the areas have been there for 5 years. She reports the areas are not bothersome.Patient rates irritation 0 out of 10. She states that the areas have not spread. Patient reports she has previously been treated for these areas with OTC clearing treatments. Patient denies Hx of bx. Patient denies family history of skin cancer(s).   The following portions of the chart were reviewed this encounter and updated as appropriate: medications, allergies, medical history  Review of Systems:  No other skin or systemic complaints except as noted in HPI or Assessment and Plan.  Objective  Well appearing patient in no apparent distress; mood and affect are within normal limits.   A focused examination was performed of the following areas: face   Relevant exam findings are noted in the Assessment and Plan.           Assessment & Plan   Melasma - Assessment: Patient presents with dark patches on the skin, consistent with melasma. The condition is attributed to hormonal changes and sun exposure. Previous treatments include tretinoin, but current use is unclear. The patient's sister has had success with a combination cream containing hydroquinone, tretinoin, and hydrocortisone.  - Plan:   - Prescribe compounded cream from [facility] pharmacy containing hydroquinone, tretinoin, and other ingredients.   - Instruct patient to apply a pea-sized amount every other night for 2 weeks, then nightly if no irritation occurs.   - Recommend using hyaluronic acid serum (Vichy brand) before applying the prescription cream.   - Advise applying moisturizer after the prescription cream.   - Morning regimen should include face wash, hyaluronic acid serum, and sunscreen.   - Emphasize daily  sunscreen use and reapplication in the afternoon.   - Schedule follow-up in 4 months to reassess and consider switching to tranexamic acid if necessary.    No follow-ups on file.    Documentation: I have reviewed the above documentation for accuracy and completeness, and I agree with the above.  I, Shirron Marcha Solders, CMA, am acting as scribe for [employer], DO.   Langston Reusing, DO

## 2023-05-04 NOTE — Patient Instructions (Addendum)
Hello Ms. Bohnsack,  Thank you for visiting my office today. Your dedication to enhancing your skin health is greatly appreciated. Below is a summary of the essential instructions from our discussion today:  - Daily Sunscreen Use: It's important to apply your preferred sunscreen every morning and reapply in the afternoon. This routine is crucial for protecting your skin from sun exposure, which can exacerbate melasma.  - Prescription Cream: You've been prescribed a cream that combines hydroquinone, tretinoin, kojic acid, and hydrocortisone. Here's how to integrate it into your skincare routine:   - Start by applying the cream every other night after cleansing your face.   - If your skin shows no signs of dryness after two weeks, you may increase the application to every night.   - Application Routine: Begin with washing your face, then apply a hyaluronic acid serum (Vichy is recommended), followed by a pea-sized amount of the prescription cream, and finish with a moisturizer.  - Morning Routine: Your morning should start with washing your face, applying hyaluronic acid serum, and then applying sunscreen to ensure protection throughout the day.  - Follow-Up: We have scheduled a follow-up appointment in four months to assess your progress. Depending on the outcome, we may consider switching to tranexamic acid for continued treatment.  - Medication Purchase: [facility] pharmacy will be in touch regarding the payment ($45) for your prescription, which will be mailed to you.   Please make sure to respond when [facility] pharmacy reaches out to arrange for your prescription delivery. Should you have any questions or concerns before our next meeting, do not hesitate to contact the office.  Warm regards,  Dr. Langston Reusing Dermatology        Important Information  Due to recent changes in healthcare laws, you may see results of your pathology and/or laboratory studies on MyChart before the doctors have  had a chance to review them. We understand that in some cases there may be results that are confusing or concerning to you. Please understand that not all results are received at the same time and often the doctors may need to interpret multiple results in order to provide you with the best plan of care or course of treatment. Therefore, we ask that you please give [place] 2 business days to thoroughly review all your results before contacting the office for clarification. Should we see a critical lab result, you will be contacted sooner.   If You Need Anything After Your Visit  If you have any questions or concerns for your doctor, please call our main line at [phone] If no one answers, please leave a voicemail as directed and we will return your call as soon as possible. Messages left after 4 pm will be answered the following business day.   You may also send [place] a message via MyChart. We typically respond to MyChart messages within 1-2 business days.  For prescription refills, please ask your pharmacy to contact our office. Our fax number is [phone].  If you have an urgent issue when the clinic is closed that cannot wait until the next business day, you can page your doctor at the number below.    Please note that while we do our best to be available for urgent issues outside of office hours, we are not available 24/7.   If you have an urgent issue and are unable to reach [place], you may choose to seek medical care at your doctor's office, retail clinic, urgent care center, or emergency room.  If you have a medical emergency, please immediately call 911 or go to the emergency department. In the event of inclement weather, please call our main line at [phone] for an update on the status of any delays or closures.  Dermatology Medication Tips: Please keep the boxes that topical medications come in in order to help keep track of the instructions about where and how to use these. Pharmacies  typically print the medication instructions only on the boxes and not directly on the medication tubes.   If your medication is too expensive, please contact our office at [phone] or send [place] a message through MyChart.   We are unable to tell what your co-pay for medications will be in advance as this is different depending on your insurance coverage. However, we may be able to find a substitute medication at lower cost or fill out paperwork to get insurance to cover a needed medication.   If a prior authorization is required to get your medication covered by your insurance company, please allow [place] 1-2 business days to complete this process.  Drug prices often vary depending on where the prescription is filled and some pharmacies may offer cheaper prices.  The website www.goodrx.com contains coupons for medications through different pharmacies. The prices here do not account for what the cost may be with help from insurance (it may be cheaper with your insurance), but the website can give you the price if you did not use any insurance.  - You can print the associated coupon and take it with your prescription to the pharmacy.  - You may also stop by our office during regular business hours and pick up a GoodRx coupon card.  - If you need your prescription sent electronically to a different pharmacy, notify our office through [facility] or by phone at [phone]

## 2023-07-12 ENCOUNTER — Emergency Department (HOSPITAL_COMMUNITY)
Admission: EM | Admit: 2023-07-12 | Discharge: 2023-07-12 | Disposition: A | Discharge status: Home / Self Care | Payer: BLUE CROSS/BLUE SHIELD | Attending: Emergency Medicine | Admitting: Emergency Medicine

## 2023-07-12 ENCOUNTER — Emergency Department (HOSPITAL_COMMUNITY): Payer: BLUE CROSS/BLUE SHIELD

## 2023-07-12 ENCOUNTER — Other Ambulatory Visit: Payer: Self-pay

## 2023-07-12 ENCOUNTER — Encounter (HOSPITAL_COMMUNITY): Payer: Self-pay | Admitting: *Deleted

## 2023-07-12 DIAGNOSIS — U071 COVID-19: Secondary | ICD-10-CM | POA: Diagnosis not present

## 2023-07-12 DIAGNOSIS — N939 Abnormal uterine and vaginal bleeding, unspecified: Secondary | ICD-10-CM | POA: Insufficient documentation

## 2023-07-12 DIAGNOSIS — M791 Myalgia, unspecified site: Secondary | ICD-10-CM | POA: Diagnosis present

## 2023-07-12 LAB — COMPREHENSIVE METABOLIC PANEL
ALT: 52 U/L — ABNORMAL HIGH (ref 0–44)
AST: 38 U/L (ref 15–41)
Albumin: 4.1 g/dL (ref 3.5–5.0)
Alkaline Phosphatase: 35 U/L — ABNORMAL LOW (ref 38–126)
Anion gap: 11 (ref 5–15)
BUN: 10 mg/dL (ref 6–20)
CO2: 25 mmol/L (ref 22–32)
Calcium: 9.3 mg/dL (ref 8.9–10.3)
Chloride: 101 mmol/L (ref 98–111)
Creatinine, Ser: 0.83 mg/dL (ref 0.44–1.00)
GFR, Estimated: >60 mL/min (ref >60)
Glucose, Bld: 101 mg/dL — ABNORMAL HIGH (ref 70–99)
Potassium: 3.8 mmol/L (ref 3.5–5.1)
Sodium: 137 mmol/L (ref 135–145)
Total Bilirubin: 0.5 mg/dL (ref 0.0–1.2)
Total Protein: 7.3 g/dL (ref 6.5–8.1)

## 2023-07-12 LAB — CBC
HCT: 36.7 % (ref 36.0–46.0)
Hemoglobin: 11.9 g/dL — ABNORMAL LOW (ref 12.0–15.0)
MCH: 27.3 pg (ref 26.0–34.0)
MCHC: 32.4 g/dL (ref 30.0–36.0)
MCV: 84.2 fL (ref 80.0–100.0)
Platelets: 172 10*3/uL (ref 150–400)
RBC: 4.36 MIL/uL (ref 3.87–5.11)
RDW: 12.5 % (ref 11.5–15.5)
WBC: 6.7 10*3/uL (ref 4.0–10.5)
nRBC: 0 % (ref 0.0–0.2)

## 2023-07-12 LAB — TROPONIN I (HIGH SENSITIVITY): Troponin I (High Sensitivity): <2 ng/L (ref <18)

## 2023-07-12 LAB — HCG, QUANTITATIVE, PREGNANCY: hCG, Beta Chain, Quant, S: 1 m[IU]/mL (ref <5)

## 2023-07-12 LAB — RESP PANEL BY RT-PCR (RSV, FLU A&B, COVID)  RVPGX2
Influenza A by PCR: NEGATIVE
Influenza B by PCR: NEGATIVE
Resp Syncytial Virus by PCR: NEGATIVE
SARS Coronavirus 2 by RT PCR: POSITIVE — AB

## 2023-07-12 MED ORDER — NAPROXEN 500 MG PO TABS: 500.0000 mg | ORAL_TABLET | Freq: Two times a day (BID) | ORAL | 0 refills | Status: AC

## 2023-07-12 MED ORDER — BENZONATATE 100 MG PO CAPS: 100.0000 mg | ORAL_CAPSULE | Freq: Three times a day (TID) | ORAL | 0 refills | Status: AC

## 2023-07-12 NOTE — ED Provider Triage Note (Signed)
 Emergency Medicine Provider Triage Evaluation Note  Kelly Rosario , a 33 y.o. female  was evaluated in triage.  Pt complains of body aches, chest pain, cough, rhinorrhea, fever.  Review of Systems  Positive:  Negative:   Physical Exam  BP 124/89 (BP Location: Left Arm)   Pulse 85   Temp 99.4 F (37.4 C) (Oral)   Resp 16   Ht 5\' 4"  (1.626 m)   Wt 79.8 kg   LMP 07/12/2023   SpO2 98%   BMI 30.20 kg/m  Gen:   Awake, no distress   Resp:  Normal effort  MSK:   Moves extremities without difficulty  Other:    Medical Decision Making  Medically screening exam initiated at 6:52 PM.  Appropriate orders placed.  Kelly Rosario was informed that the remainder of the evaluation will be completed by another provider, this initial triage assessment does not replace that evaluation, and the importance of remaining in the ED until their evaluation is complete.  Generalized body aches, chest pain, slight cough, recent rhinorrhea, and recent fever. No nausea, vomiting, diarrhea.    Dorthy Cooler, [place] 07/12/23 [phone]

## 2023-07-12 NOTE — ED Provider Notes (Signed)
 [place] EMERGENCY DEPARTMENT AT [facility] Provider Note   CSN: [id number] Arrival date & time: 07/12/23  1827    History  Chief Complaint  Patient presents with   Generalized Body Aches    Kelly Rosario is a 33 y.o. female here for evaluation of feeling unwell.  Patient states over the last week she has felt unwell.  On Monday she had a fever, took Tylenol, fever resolved.  She continues to have myalgias, cough.  She has chest pain when she coughs.  No chest pain at rest.  No exertional chest pain.  She states she hurts everywhere to her extremities. Feels generally weak.  She also would like to be assessed for abnormal menstrual cycles.  States she can go 3 to 4 months without a period.  She has had 3 menstrual cycles since the beginning of January.  She also has breakthrough bleeding with spotting.  States when this occurs she will get lower abdominal cramping which goes into her back.  No current abdominal pain or lower back pain.  She has not followed with her PCP.  She has not any birth control.  She has some chronic clear vaginal discharge however this is unchanged according to patient.  No concern for any STI.  No dysuria or hematuria.    HPI     Home Medications Prior to Admission medications   Medication Sig Start Date End Date Taking? Authorizing Provider  benzonatate (TESSALON) 100 MG capsule Take 1 capsule (100 mg total) by mouth every 8 (eight) hours. 07/12/23  Yes Levone Otten A, PA-C  naproxen (NAPROSYN) 500 MG tablet Take 1 tablet (500 mg total) by mouth 2 (two) times daily. 07/12/23  Yes Kelliann Pendergraph A, PA-C  Prenatal Vit-Fe Fumarate-FA (PRENATAL MULTIVITAMIN) TABS Take 1 tablet by mouth daily.    [provider]  Safety Seal Miscellaneous MISC DeLight with hydroquinone USP 8%, kojic acid USP 2% tretinoin USP 0.025% hydrocortisone USP 1% hyaluronic acid EXCP 0.1% 05/04/23   Terri Piedra, DO      Allergies    Patient has no known  allergies.    Review of Systems   Review of Systems  Constitutional:  Positive for activity change, appetite change and fever.  HENT:  Positive for congestion and rhinorrhea. Negative for sore throat.   Respiratory:  Positive for cough. Negative for shortness of breath, wheezing and stridor.   Cardiovascular:  Positive for chest pain. Negative for palpitations and leg swelling.  Gastrointestinal: Negative.   Genitourinary:  Positive for menstrual problem and vaginal discharge (chronic, clear).  Skin: Negative.   Neurological:  Positive for weakness (generalized).  All other systems reviewed and are negative.   Physical Exam Updated Vital Signs BP (!) 136/97 (BP Location: Left Arm)   Pulse 72   Temp 98.5 F (36.9 C) (Oral)   Resp 18   Ht 5\' 4"  (1.626 m)   Wt 79.8 kg   LMP 07/12/2023   SpO2 98%   BMI 30.20 kg/m  Physical Exam Vitals and nursing note reviewed.  Constitutional:      General: She is not in acute distress.    Appearance: She is well-developed. She is not ill-appearing, toxic-appearing or diaphoretic.  HENT:     Head: Normocephalic and atraumatic.     Nose: Nose normal.     Mouth/Throat:     Mouth: Mucous membranes are moist.     Comments: PO clear, uvula midline, No pooling of secretions Eyes:  Pupils: Pupils are equal, round, and reactive to light.  Cardiovascular:     Rate and Rhythm: Normal rate.     Pulses: Normal pulses.     Heart sounds: Normal heart sounds.  Pulmonary:     Effort: Pulmonary effort is normal. No respiratory distress.     Breath sounds: Normal breath sounds.  Abdominal:     General: Bowel sounds are normal. There is no distension.     Palpations: Abdomen is soft.     Tenderness: There is no abdominal tenderness. There is no right CVA tenderness, left CVA tenderness, guarding or rebound.  Genitourinary:    Comments: declined Musculoskeletal:        General: No swelling, tenderness, deformity or signs of injury. Normal range of  motion.     Cervical back: Normal range of motion.     Right lower leg: No edema.     Left lower leg: No edema.  Skin:    General: Skin is warm and dry.     Capillary Refill: Capillary refill takes less than 2 seconds.  Neurological:     General: No focal deficit present.     Mental Status: She is alert and oriented to person, place, and time.     Cranial Nerves: No cranial nerve deficit.     Motor: No weakness.     Gait: Gait normal.  Psychiatric:        Mood and Affect: Mood normal.     ED Results / Procedures / Treatments   Labs (all labs ordered are listed, but only abnormal results are displayed) Labs Reviewed  RESP PANEL BY RT-PCR (RSV, FLU A&B, COVID)  RVPGX2 - Abnormal; Notable for the following components:      Result Value   SARS Coronavirus 2 by RT PCR POSITIVE (*)    All other components within normal limits  CBC - Abnormal; Notable for the following components:   Hemoglobin 11.9 (*)    All other components within normal limits  COMPREHENSIVE METABOLIC PANEL - Abnormal; Notable for the following components:   Glucose, Bld 101 (*)    ALT 52 (*)    Alkaline Phosphatase 35 (*)    All other components within normal limits  HCG, QUANTITATIVE, PREGNANCY  TROPONIN I (HIGH SENSITIVITY)    EKG None  Radiology DG Chest 2 View Result Date: 07/12/2023 CLINICAL DATA:  Chest pain and body aches. EXAM: CHEST - 2 VIEW COMPARISON:  AP chest 01/13/2011 FINDINGS: Cardiac silhouette and mediastinal contours are within limits. The appearance of the left hilum of a centrally dense region and peripheral lower density is unchanged from 01/13/2011 and appears to represent normal pulmonary vessels on end. No new opacity is seen to indicate pneumonia. No pulmonary edema, pleural effusion, or pneumothorax. No acute skeletal abnormality. IMPRESSION: No active cardiopulmonary disease. Electronically Signed   By: Neita Garnet M.D.   On: 07/12/2023 20:12    Procedures Procedures     Medications Ordered in ED Medications - No data to display  ED Course/ Medical Decision Making/ A&P 33 year old here for evaluation of feeling unwell.  Patient with URI symptoms, chest pain with coughing.  She is afebrile, nonseptic, non-ill-appearing.  Her chest pain is reproducible.  She is PERC negative.  No history of PE or DVT.  I suspect she likely has a viral syndrome.  With regards to her abnormal menstrual cycle this is been ongoing.  She will go months without a cycle and then will get multiple cycles in  the month.  She has had 3 episodes of vaginal bleeding since the beginning of January.  When she gets her cycle she does get lower abdominal cramping that goes into her back however she has no pain currently.  Labs and imaging personally viewed and interpreted:  CBC without leukocytosis 9 metabolic panel ALT 52 Troponin less than Pregnancy test negative COVID-positive Chest x-ray without cardiomegaly, pulm edema, pneumothorax EKG without ischemic  I consulted TOC to help her establish care with a PCP.  With regards to your COVID I have low suspicion for secondary bacterial infection, ACS, PE, dissection, unstable angina.  Will give her some naproxen, encourage p.o. hydration at home  Regards to her abnormal menstrual cycles been ongoing issue.  She currently has no pain.  Will have her follow-up with her PCP for this.  Low suspicion for intermittent/persistent torsion, PID, TOA, obstruction, perforation, bacterial infectious process  The patient has been appropriately medically screened and/or stabilized in the ED. I have low suspicion for any other emergent medical condition which would require further screening, evaluation or treatment in the ED or require inpatient management.  Patient is hemodynamically stable and in no acute distress.  Patient able to ambulate in department prior to ED.  Evaluation does not show acute pathology that would require ongoing or additional  emergent interventions while in the emergency department or further inpatient treatment.  I have discussed the diagnosis with the patient and answered all questions.  Pain is been managed while in the emergency department and patient has no further complaints prior to discharge.  Patient is comfortable with plan discussed in room and is stable for discharge at this time.  I have discussed strict return precautions for returning to the emergency department.  Patient was encouraged to follow-up with PCP/specialist refer to at discharge.                                Medical Decision Making Amount and/or Complexity of Data Reviewed External Data Reviewed: labs, radiology, ECG and notes. Labs: ordered. Decision-making details documented in ED Course. Radiology: ordered and independent interpretation performed. Decision-making details documented in ED Course. ECG/medicine tests: ordered and independent interpretation performed. Decision-making details documented in ED Course.  Risk OTC drugs. Prescription drug management. Decision regarding hospitalization. Diagnosis or treatment significantly limited by social determinants of health.           Final Clinical Impression(s) / ED Diagnoses Final diagnoses:  COVID  Abnormal vaginal bleeding    Rx / DC Orders ED Discharge Orders          Ordered    naproxen (NAPROSYN) 500 MG tablet  2 times daily        07/12/23 2322    benzonatate (TESSALON) 100 MG capsule  Every 8 hours        07/12/23 2322              Keighley Deckman A, PA-C 07/12/23 2330    Elayne Snare K, DO 07/12/23 2355

## 2023-07-12 NOTE — Discharge Instructions (Addendum)
 I have placed an order for social work to call you to help set you up with a primary care provider  I have written for a few medications to help with your symptoms  Return for new or worsening symptoms

## 2023-07-12 NOTE — ED Triage Notes (Signed)
 The pt  has multiple complaints she has had irregular periods for several months   and she has had a period x3 since January  she is also c/o bodat aches back pain chest all over her body  lmp feb now

## 2023-07-13 ENCOUNTER — Telehealth: Payer: Self-pay

## 2023-07-13 NOTE — Telephone Encounter (Signed)
 Received TOC consult post discharge for PCP needs. This RNCM used [employer] interpretor [phone] for language:N[place]ali to call member however received a message that wireless customer is not available and unable to leave a voicemail message.  Patient can use her insurance provider to select a PCP.

## 2023-09-01 ENCOUNTER — Ambulatory Visit (INDEPENDENT_AMBULATORY_CARE_PROVIDER_SITE_OTHER): Payer: BLUE CROSS/BLUE SHIELD | Admitting: Dermatology

## 2023-09-01 ENCOUNTER — Encounter: Payer: Self-pay | Admitting: Dermatology

## 2023-09-01 VITALS — BP 128/81

## 2023-09-01 DIAGNOSIS — L811 Chloasma: Secondary | ICD-10-CM

## 2023-09-01 MED ORDER — SAFETY SEAL MISCELLANEOUS MISC: 1.0000 "application " | Freq: Two times a day (BID) | 3 refills | Status: DC

## 2023-09-01 NOTE — Progress Notes (Signed)
   Follow-Up Visit   Subjective  Kelly Rosario is a 33 y.o. female who presents for the following: Melasma follow up - She used DeLight cream from [facility] for 6-8 weeks but she did not reorder it because she did not think it was working. She is using HA serum and sunscreen.   The following portions of the chart were reviewed this encounter and updated as appropriate: medications, allergies, medical history  Review of Systems:  No other skin or systemic complaints except as noted in HPI or Assessment and Plan.  Objective  Well appearing patient in no apparent distress; mood and affect are within normal limits.   A focused examination was performed of the following areas: Face  Relevant exam findings are noted in the Assessment and Plan.    Assessment & Plan   MELASMA Exam: reticulated hyperpigmented patches at face  - Assessment: Minimal improvement with previous treatment, likely due to incomplete adherence. Previous treatment discontinued after 6 weeks instead of recommended 3 months. Current skincare routine (Korean hydrating product and sunscreen) insufficient for treating melasma.  - Plan:    Discontinue previous prescription (presumed hydroquinone)    Start Radiant Tone (containing thiamidol) twice daily    Prescribe new MedRock formulation with transaminic acid twice daily    Morning skincare routine:     1. Wash face     2. Apply Vichy product     3. Apply Radiant Tone     4. Apply MedRock Transaminic Acid cream     5. Apply sunscreen    Evening skincare routine:     1. Wash face     2. Apply Vichy product     3. Apply Radiant Tone     4. Apply MedRock Melasmic Transaminic Acid Cream    Recommend CeraVe mineral sunscreen stick for additional protection in high-sun exposure situations    Patient education:     - Emphasize importance of adherence to full 10-month treatment course     - Instruct on proper application of new skincare regimen     - Stress importance of  sun protection    Provide samples of tinted sunscreen for patient to try    Return in about 4 months (around 01/01/2024).  I, Eliot Guernsey, CMA, am acting as scribe for [employer], DO .   Documentation: I have reviewed the above documentation for accuracy and completeness, and I agree with the above.  Louana Roup, DO

## 2023-09-01 NOTE — Patient Instructions (Addendum)
 Hello Carri,  Thank you for visiting today. Here is a summary of the key instructions:  - Morning Routine:   - Wash face   - Apply Vichy Mineral 89 HA Serum product   - Apply Eucerin Radiant Tone   - Apply MedRock Transaminic Acid Cream   - Apply sunscreen  - Night Routine:   - Wash face   - Apply Vichy Mineral 89 HA Serum product   - Apply  Eucerin Radiant Tone   - Apply MedRock Melasmic Transaminic Acid Cream   - Sun Protection:   - Use extra sunscreen when going to the beach or sunny places   - Try CeraVe mineral sunscreen stick for extra protection  - Product Information:   - Radiant Tone can be bought at [employer], [employer], [facility], or [employer]  - Follow-up:   - Expect to see results after 4 months of consistent use   - Contact [facility] if you don't hear from them by tomorrow   - Call [facility] for prescription refills  - Additional Instructions:   - Check your after-visit summary for a picture of the recommended CeraVe sunscreen stick   - Try the provided samples of tinted sunscreen  Please reach out if you have any questions or concerns.  Warm regards,  Dr. [facility]          Recommended Sunscreen     Your provider has sent your prescription to [facility] in [place], [place]. A pharmacy representative will call you to confirm details and take your payment information. If you do not receive a call within 24 hours, please contact the pharmacy at [phone] or [phone]. Your unique skincare compound is being formulated in our lab (most compounds take less than 24 hours). Your prescription is shipped vis USPS to your mailbox (2-4 business days). Priority shipping is available at an additional cost. Once received, you will electronically sign/acknowledge that you received your prescription. The pharmacy hours are Monday-Friday 9 am-6 pm EST and Saturday 9 am-1 pm EST.     Important Information  Due to recent changes in healthcare  laws, you may see results of your pathology and/or laboratory studies on MyChart before the doctors have had a chance to review them. We understand that in some cases there may be results that are confusing or concerning to you. Please understand that not all results are received at the same time and often the doctors may need to interpret multiple results in order to provide you with the best plan of care or course of treatment. Therefore, we ask that you please give [place] 2 business days to thoroughly review all your results before contacting the office for clarification. Should we see a critical lab result, you will be contacted sooner.   If You Need Anything After Your Visit  If you have any questions or concerns for your doctor, please call our main line at [phone] If no one answers, please leave a voicemail as directed and we will return your call as soon as possible. Messages left after 4 pm will be answered the following business day.   You may also send [place] a message via MyChart. We typically respond to MyChart messages within 1-2 business days.  For prescription refills, please ask your pharmacy to contact our office. Our fax number is [phone].  If you have an urgent issue when the clinic is closed that cannot wait until the next business day, you can page your doctor at the number below.  Please note that while we do our best to be available for urgent issues outside of office hours, we are not available 24/7.   If you have an urgent issue and are unable to reach us , you may choose to seek medical care at your doctor's office, retail clinic, urgent care center, or emergency room.  If you have a medical emergency, please immediately call 911 or go to the emergency department. In the event of inclement weather, please call our main line at [phone] for an update on the status of any delays or closures.  Dermatology Medication Tips: Please keep the boxes that topical  medications come in in order to help keep track of the instructions about where and how to use these. Pharmacies typically print the medication instructions only on the boxes and not directly on the medication tubes.   If your medication is too expensive, please contact our office at [phone] or send us  a message through MyChart.   We are unable to tell what your co-pay for medications will be in advance as this is different depending on your insurance coverage. However, we may be able to find a substitute medication at lower cost or fill out paperwork to get insurance to cover a needed medication.   If a prior authorization is required to get your medication covered by your insurance company, please allow us  1-2 business days to complete this process.  Drug prices often vary depending on where the prescription is filled and some pharmacies may offer cheaper prices.  The website www.goodrx.com contains coupons for medications through different pharmacies. The prices here do not account for what the cost may be with help from insurance (it may be cheaper with your insurance), but the website can give you the price if you did not use any insurance.  - You can print the associated coupon and take it with your prescription to the pharmacy.  - You may also stop by our office during regular business hours and pick up a GoodRx coupon card.  - If you need your prescription sent electronically to a different pharmacy, notify our office through [facility] or by phone at [phone]

## 2024-01-04 ENCOUNTER — Ambulatory Visit (INDEPENDENT_AMBULATORY_CARE_PROVIDER_SITE_OTHER): Admitting: Dermatology

## 2024-01-04 ENCOUNTER — Encounter: Payer: Self-pay | Admitting: Dermatology

## 2024-01-04 VITALS — BP 94/65

## 2024-01-04 DIAGNOSIS — L811 Chloasma: Secondary | ICD-10-CM

## 2024-01-04 MED ORDER — TRETINOIN 0.025 % EX CREA: TOPICAL_CREAM | CUTANEOUS | 2 refills | Status: DC

## 2024-01-04 MED ORDER — SAFETY SEAL MISCELLANEOUS MISC: 1.0000 "application " | Freq: Two times a day (BID) | 3 refills | Status: AC

## 2024-01-04 MED ORDER — TRETINOIN 0.025 % EX CREA: TOPICAL_CREAM | CUTANEOUS | 2 refills | Status: AC

## 2024-01-04 NOTE — Progress Notes (Signed)
   Follow-Up Visit   Subjective  Kelly Rosario is a 33 y.o. female established patient who presents for FOLLOW UP on the diagnoses listed below:  Patient was last evaluated on 09/01/23.   Melasma: Prescribed Medrock melaxemic cream to apply BID - refills needed. Recommended Eucerin RT serum. Patient reports sxs are better.  Are you nursing, pregnant or trying to conceive? No   The following portions of the chart were reviewed this encounter and updated as appropriate: medications, allergies, medical history  Review of Systems:  No other skin or systemic complaints except as noted in HPI or Assessment and Plan.  Objective  Well appearing patient in no apparent distress; mood and affect are within normal limits.   A focused examination was performed of the following areas: face   Relevant exam findings are noted in the Assessment and Plan.          Assessment & Plan   1. Melasma - Assessment: Patient's melasma has flared up and appears darker than previous visits, which is consistent with typical summer exacerbations. Previous treatment with hydroquinone was effective but caused loss of natural pigment, leading to a switch to melasmic cream. Current skincare routine includes use of a mineral sunscreen twice daily, though consistent hat use during outdoor activities is lacking. - Plan:    Continue Melasmic cream from Medrock, applied morning and night    Add tretinoin  0.025% at night starting with twice weekly application, then increase to three times weekly if no irritation    Modify face washing routine with CeraVe with salicylic acid in morning and Dove soap in evening    Prescribe 7% tranexamic acid (new formulation of Melasmic cream)    Reinforce importance of consistent sunscreen application, including reapplication every 3-4 hours    Emphasize use of sun protective measures during outdoor activities and while driving    No follow-ups on file.   Documentation: I have  reviewed the above documentation for accuracy and completeness, and I agree with the above.  I, Shirron Maranda, CMA, am acting as scribe for [employer], DO.   Delon Lenis, DO

## 2024-01-04 NOTE — Patient Instructions (Addendum)
 Date: Mon Jan 04 2024  Hello Kelly Rosario,  Thank you for visiting today. Here is a summary of the key instructions:  - Medications:   - Start tretinoin  cream 2 nights per week, increase to 3 nights per week after 2 weeks if no stinging or burning   - Continue Melasmic Cream by MedRock morning and night   - Continue Eucerin Radiant Tone morning and night  - Skin Care:   - Use LaRoche Posay  salicylic acid cleanser in the morning   - Use Dove soap at night   - Apply Vichy Serum in the morning   - Continue daily sunscreen application with reapplication every 3 to 4 hours   - Use sun protection even when driving  - Other Instructions:   - Wear a hat when outside   - Message through MyChart for any questions  Please reach out if you have any questions or concerns.  Warm regards,  Dr. [facility]is [facility]   Important Information  Due to recent changes in healthcare laws, you may see results of your pathology and/or laboratory studies on MyChart before the doctors have had a chance to review them. We understand that in some cases there may be results that are confusing or concerning to you. Please understand that not all results are received at the same time and often the doctors may need to interpret multiple results in order to provide you with the best plan of care or course of treatment. Therefore, we ask that you please give us  2 business days to thoroughly review all your results before contacting the office for clarification. Should we see a critical lab result, you will be contacted sooner.   If You Need Anything After Your Visit  If you have any questions or concerns for your doctor, please call our main line at [phone] If no one answers, please leave a voicemail as directed and we will return your call as soon as possible. Messages left after 4 pm will be answered the following business day.   You may also send us  a message via MyChart. We typically respond to MyChart  messages within 1-2 business days.  For prescription refills, please ask your pharmacy to contact our office. Our fax number is [phone].  If you have an urgent issue when the clinic is closed that cannot wait until the next business day, you can page your doctor at the number below.    Please note that while we do our best to be available for urgent issues outside of office hours, we are not available 24/7.   If you have an urgent issue and are unable to reach us , you may choose to seek medical care at your doctor's office, retail clinic, urgent care center, or emergency room.  If you have a medical emergency, please immediately call 911 or go to the emergency department. In the event of inclement weather, please call our main line at [phone] for an update on the status of any delays or closures.  Dermatology Medication Tips: Please keep the boxes that topical medications come in in order to help keep track of the instructions about where and how to use these. Pharmacies typically print the medication instructions only on the boxes and not directly on the medication tubes.   If your medication is too expensive, please contact our office at [phone] or send us  a message through MyChart.   We are unable to tell what your co-pay for medications will be in advance  as this is different depending on your insurance coverage. However, we may be able to find a substitute medication at lower cost or fill out paperwork to get insurance to cover a needed medication.   If a prior authorization is required to get your medication covered by your insurance company, please allow us  1-2 business days to complete this process.  Drug prices often vary depending on where the prescription is filled and some pharmacies may offer cheaper prices.  The website www.goodrx.com contains coupons for medications through different pharmacies. The prices here do not account for what the cost may be with help  from insurance (it may be cheaper with your insurance), but the website can give you the price if you did not use any insurance.  - You can print the associated coupon and take it with your prescription to the pharmacy.  - You may also stop by our office during regular business hours and pick up a GoodRx coupon card.  - If you need your prescription sent electronically to a different pharmacy, notify our office through [facility] or by phone at [phone]

## 2024-07-07 ENCOUNTER — Ambulatory Visit: Admitting: Dermatology
